# Patient Record
Sex: Female | Born: 1935 | Hispanic: No | Smoking: Never smoker
Health system: Southern US, Community
[De-identification: ages and names within clinical notes are randomized; demographics above are authoritative.]

## PROBLEM LIST (undated history)

## (undated) DIAGNOSIS — C801 Malignant (primary) neoplasm, unspecified: Secondary | ICD-10-CM

## (undated) DIAGNOSIS — E0590 Thyrotoxicosis, unspecified without thyrotoxic crisis or storm: Secondary | ICD-10-CM

## (undated) DIAGNOSIS — E049 Nontoxic goiter, unspecified: Secondary | ICD-10-CM

## (undated) DIAGNOSIS — E785 Hyperlipidemia, unspecified: Secondary | ICD-10-CM

## (undated) DIAGNOSIS — K5909 Other constipation: Secondary | ICD-10-CM

## (undated) DIAGNOSIS — E119 Type 2 diabetes mellitus without complications: Secondary | ICD-10-CM

## (undated) DIAGNOSIS — I10 Essential (primary) hypertension: Secondary | ICD-10-CM

## (undated) HISTORY — PX: MASTECTOMY: SHX3

---

## 1994-11-04 HISTORY — PX: BREAST LUMPECTOMY: SHX2

## 2005-11-04 HISTORY — PX: ABDOMINAL HYSTERECTOMY: SHX81

## 2012-12-24 ENCOUNTER — Encounter (INDEPENDENT_AMBULATORY_CARE_PROVIDER_SITE_OTHER): Payer: Self-pay | Admitting: General Surgery

## 2012-12-28 ENCOUNTER — Telehealth: Payer: Self-pay | Admitting: *Deleted

## 2012-12-28 NOTE — Telephone Encounter (Signed)
Left message for pt to return my call so I can schedule a Med Onc appt. 

## 2012-12-28 NOTE — Telephone Encounter (Signed)
Confirmed 01/01/13 appt w/ pt's daughter Kamela Blansett.  Angela Burke at referring to make her aware.  Unable to mail before letter & packet - gave verbal.  Emailed Christy at [facility] to make her aware.  Took paperwork to Med Rec for chart.

## 2012-12-29 ENCOUNTER — Telehealth: Payer: Self-pay | Admitting: *Deleted

## 2012-12-29 NOTE — Telephone Encounter (Signed)
Pt's daughter called stating that her mom is very overwhelmed right now and requested that we reschedule this appt.  Confirmed 01/18/13 appt w/ Dani.  Mailed before appt letter & packet to her.  Emailed Christy at [facility] to make her aware.  Took updated paperwork to med rec for chart.

## 2012-12-30 ENCOUNTER — Encounter (INDEPENDENT_AMBULATORY_CARE_PROVIDER_SITE_OTHER): Payer: Self-pay

## 2012-12-31 ENCOUNTER — Ambulatory Visit (INDEPENDENT_AMBULATORY_CARE_PROVIDER_SITE_OTHER): Payer: 59 | Admitting: General Surgery

## 2012-12-31 ENCOUNTER — Encounter (INDEPENDENT_AMBULATORY_CARE_PROVIDER_SITE_OTHER): Payer: Self-pay | Admitting: General Surgery

## 2012-12-31 VITALS — BP 136/72 | HR 86 | Temp 98.0°F | Resp 18 | Ht 60.0 in | Wt 152.0 lb

## 2012-12-31 DIAGNOSIS — Z853 Personal history of malignant neoplasm of breast: Secondary | ICD-10-CM

## 2012-12-31 DIAGNOSIS — R920 Mammographic microcalcification found on diagnostic imaging of breast: Secondary | ICD-10-CM

## 2012-12-31 NOTE — Patient Instructions (Addendum)
I will call you after your evaluation at the breast imaging center for a final decision on type of biopsy

## 2012-12-31 NOTE — Progress Notes (Signed)
Subjective:   Abnormal mammogram right breast  Patient ID: Gloria Wheeler, female   DOB: Oct 20, 1926, 79 y.o.   MRN: [id number]  HPI Patient is a pleasant 79 year old female recently moved to [facility] referred by the [facility] for an abnormal mammogram. The patient is a somewhat vague historian but is accompanied by her daughter who is a very good historian. He also have old records. The patient has a history of ductal carcinoma in situ of the left breast treated with lumpectomy and radiation about 1996. 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 She received 5 years of tamoxifen. The patient about 4 months ago was found to have enlargement of the previously noted area of microcalcifications in the upper-outer quadrant of the right breast. Initially the patient and family opted for 6 month followup. However they decided to proceed with biopsy and they did not want to have a stereotactic biopsy when the patient alert she would have to lie prone for about an hour as she did not feel she can tolerate this. There year to discuss possible surgical biopsy. She has not had any breast symptoms. Specifically no lumps or pain or discharge.  No past medical history on file. Past Surgical History  Procedure Laterality Date  . Abdominal hysterectomy  2007  . Breast surgery      left masty  . Breast lumpectomy  1996    left   Current Outpatient Prescriptions  Medication Sig Dispense Refill  . B-D INS SYRINGE 0.5CC/30GX1/2" 30G X 1/2" 0.5 ML MISC       . calcium citrate-vitamin D (CITRACAL+D) 315-200 MG-UNIT per tablet Take 1 tablet by mouth 2 (two) times daily.      Marland Kitchen FORA V30A BLOOD GLUCOSE TEST test strip       . HUMULIN N 100 UNIT/ML injection       . lovastatin (MEVACOR) 20 MG tablet Take 20 mg by mouth at bedtime.      . methimazole (TAPAZOLE) 5 MG tablet       . TOPROL XL 50 MG 24 hr tablet       . vitamin C  (ASCORBIC ACID) 500 MG tablet Take 500 mg by mouth daily.      Marland Kitchen liver oil-zinc oxide (DESITIN) 40 % ointment Apply topically as needed for dry skin.       No current facility-administered medications for this visit.   No Known Allergies History  Substance Use Topics  . Smoking status: Never Smoker   . Smokeless tobacco: Not on file  . Alcohol Use: Not on file     Review of Systems  Constitutional: Positive for fatigue.  Respiratory: Negative.   Cardiovascular: Positive for leg swelling. Negative for chest pain.  Gastrointestinal: Positive for constipation.  Musculoskeletal: Positive for arthralgias.       Objective:   Physical Exam BP 136/72  Pulse 86  Temp(Src) 98 F (36.7 C)  Resp 18  Ht 5' (1.524 m)  Wt 152 lb (68.947 kg)  BMI 29.69 kg/m2 General: Elderly pleasant Afro-American female in no distress Skin: No rash infection Lymph nodes: No palpable cervical, supraclavicular, or axillary nodes HEENT: No Pepple mass or thyromegaly. Sclera nonicteric Lungs: Clear equal breath sounds bilaterally Breasts: Well-healed left mastectomy without skin changes or mass. Axilla is negative. Right breast is negative to exam without mass or skin change or nipple discharge. Cardiovascular: 1+ lower extremity edema. Regular rate and rhythm  Abdomen: Soft and nontender. No masses or organomegaly Neurologic: She is alert and fully oriented. No gross motor deficits.  Imaging: I have her mammograms on disc but on the images I am able to open him unable to see the calcifications.    Assessment:     79 year old female with history of ductal carcinoma in situ of the left breast status post mastectomy. Mammogram report indicates an increasing area of microcalcifications in the upper-outer quadrant of the right breast. I discussed this with the patient and her daughter in detail. I would like her to go to the breast center here so that they can review the previous images and discussed with the  patient and her daughter details of stereotactic biopsy. If at that time they still prefer an open surgical biopsy we would proceed with needle localized right breast lumpectomy. We discussed the nature of the surgery including the use of general anesthesia and risks involved in terms of cardiovascular complications as well as bleeding and infection and possible need for further surgery based on final diagnoses. They understand the localization would be necessary and as she's going to the breast center first they would be on board to do this procedure. I will await their decision following evaluation at the breast center.    Plan:     As above

## 2013-01-01 ENCOUNTER — Other Ambulatory Visit: Payer: Self-pay | Admitting: Lab

## 2013-01-01 ENCOUNTER — Ambulatory Visit: Payer: Self-pay | Admitting: Oncology

## 2013-01-01 ENCOUNTER — Ambulatory Visit: Payer: Self-pay

## 2013-01-07 ENCOUNTER — Other Ambulatory Visit: Payer: Self-pay | Admitting: *Deleted

## 2013-01-11 ENCOUNTER — Other Ambulatory Visit (INDEPENDENT_AMBULATORY_CARE_PROVIDER_SITE_OTHER): Payer: Self-pay | Admitting: General Surgery

## 2013-01-11 ENCOUNTER — Telehealth (INDEPENDENT_AMBULATORY_CARE_PROVIDER_SITE_OTHER): Payer: Self-pay

## 2013-01-11 NOTE — Telephone Encounter (Signed)
Called and left message for patient regarding appointment at [facility] for further Evaluation.  Appointment scheduled for Tuesday 01/12/13 @ 10:00 am.

## 2013-01-12 ENCOUNTER — Encounter (INDEPENDENT_AMBULATORY_CARE_PROVIDER_SITE_OTHER): Payer: Self-pay

## 2013-01-18 ENCOUNTER — Other Ambulatory Visit (HOSPITAL_BASED_OUTPATIENT_CLINIC_OR_DEPARTMENT_OTHER): Payer: Medicare PPO | Admitting: Lab

## 2013-01-18 ENCOUNTER — Encounter: Payer: Self-pay | Admitting: Oncology

## 2013-01-18 ENCOUNTER — Ambulatory Visit: Payer: Medicare Other

## 2013-01-18 ENCOUNTER — Telehealth: Payer: Self-pay | Admitting: *Deleted

## 2013-01-18 ENCOUNTER — Ambulatory Visit (HOSPITAL_BASED_OUTPATIENT_CLINIC_OR_DEPARTMENT_OTHER): Payer: Medicare PPO | Admitting: Oncology

## 2013-01-18 VITALS — BP 175/86 | HR 98 | Temp 98.1°F | Resp 20 | Ht 60.0 in | Wt 151.5 lb

## 2013-01-18 DIAGNOSIS — Z853 Personal history of malignant neoplasm of breast: Secondary | ICD-10-CM

## 2013-01-18 LAB — COMPREHENSIVE METABOLIC PANEL (CC13)
ALT: 13 U/L (ref 0–55)
AST: 21 U/L (ref 5–34)
CO2: 26 mEq/L (ref 22–29)
Calcium: 9.7 mg/dL (ref 8.4–10.4)
Chloride: 105 mEq/L (ref 98–107)
Sodium: 139 mEq/L (ref 136–145)
Total Bilirubin: 0.33 mg/dL (ref 0.20–1.20)
Total Protein: 6.9 g/dL (ref 6.4–8.3)

## 2013-01-18 LAB — CBC WITH DIFFERENTIAL/PLATELET
BASO%: 1.1 % (ref 0.0–2.0)
MCHC: 33.9 g/dL (ref 31.5–36.0)
MONO#: 0.3 10*3/uL (ref 0.1–0.9)
RBC: 3.87 10*6/uL (ref 3.70–5.45)
RDW: 14.5 % (ref 11.2–14.5)
WBC: 5.5 10*3/uL (ref 3.9–10.3)
lymph#: 1.1 10*3/uL (ref 0.9–3.3)

## 2013-01-18 NOTE — Progress Notes (Signed)
Gloria Wheeler [id number] 1926-02-11 79 y.o. 01/18/2013 2:26 PM  CC  Geraldo Pitter, MD [address] [place] [address] Dr. Johna Sheriff  REASON FOR CONSULTATION:  79 year old female with prior history of DCIS of the left breast status post lumpectomy now with a right-sided breast abnormality.  STAGE:   Unknown  REFERRING PHYSICIAN: Dr. Sharlet Salina Hoxworth  HISTORY OF PRESENT ILLNESS:  Gloria Wheeler is a 79 y.o. female.  With prior oncologic history significant for left breast cancer originally diagnosed in the 1990s. At that time she underwent a lumpectomy followed by radiation therapy. She was then followed and developed a recurrence of DCIS about 5 years ago. She had a left mastectomy followed by 5 years of tamoxifen. She recently relocated to [facility] from [place]. She had mammograms performed of the right breast that showed an abnormality. She was seen by Dr. Glenna Fellows who recommended a biopsy. This has not been performed yet but she is scheduled to have this done on Wednesday of this week. Clinically she seems to be doing well and is without any complaints.   Past Medical History: History reviewed. No pertinent past medical history.  Past Surgical History: Past Surgical History  Procedure Laterality Date  . Abdominal hysterectomy  2007  . Breast surgery      left masty  . Breast lumpectomy  1996    left    Family History: Family History  Problem Relation Age of Onset  . Diabetes Mother   . Diabetes Daughter   . Multiple sclerosis Son     Social History History  Substance Use Topics  . Smoking status: Never Smoker   . Smokeless tobacco: Never Used  . Alcohol Use: 0.6 oz/week    1 Cans of beer per week    Allergies: No Known Allergies  Current Medications: Current Outpatient Prescriptions  Medication Sig Dispense Refill  . B-D INS SYRINGE 0.5CC/30GX1/2" 30G X 1/2" 0.5 ML MISC       . calcium citrate-vitamin D (CITRACAL+D)  315-200 MG-UNIT per tablet Take 1 tablet by mouth 2 (two) times daily.      . Cholecalciferol (VITAMIN D-3 PO) Take 2,000 Int'l Units by mouth 2 (two) times daily.      . COD LIVER OIL PO Take by mouth daily.      Marland Kitchen FORA V30A BLOOD GLUCOSE TEST test strip       . HUMULIN N 100 UNIT/ML injection       . lovastatin (MEVACOR) 20 MG tablet Take 20 mg by mouth at bedtime.      . methimazole (TAPAZOLE) 5 MG tablet Take 5 mg by mouth daily.       . metoprolol succinate (TOPROL-XL) 100 MG 24 hr tablet Take 100 mg by mouth daily. Take with or immediately following a meal.      . vitamin C (ASCORBIC ACID) 500 MG tablet Take 500 mg by mouth daily.       No current facility-administered medications for this visit.    OB/GYN History:menarche12, menopause at age 57's, G71P2, first live birth at 75  Fertility Discussion: not applicable Prior History of Cancer: DCIS of left in [phone] s/p lumpectomy s/p radiation for 13 weeks, then 4 years ago had recurrence in 2008, tamoxifen x 5 years completed 2013  Health Maintenance: Colonoscopy yes Bone Density yes (pt with osteoporosis) Last PAP smear yes  ECOG PERFORMANCE STATUS: 1 - Symptomatic but completely ambulatory  Genetic Counseling/testing:none  REVIEW OF SYSTEMS:  A  comprehensive review of systems was negative.  PHYSICAL EXAMINATION: Blood pressure 175/86, pulse 98, temperature 98.1 F (36.7 C), temperature source Oral, resp. rate 20, height 5' (1.524 m), weight 151 lb 8 oz (68.72 kg).  ZOX:WRUEA, healthy and no distress SKIN: skin color, texture, turgor are normal HEAD: Normocephalic EYES: PERRLA, EOMI EARS: External ears normal OROPHARYNX:no exudate  NECK: no adenopathy LYMPH:  no palpable lymphadenopathy BREAST:right breast normal without mass, skin or nipple changes or axillary nodes, surgical scars noted from left mastectomy no evidence of local recurrence. LUNGS: clear to auscultation  HEART: regular rate & rhythm ABDOMEN:abdomen  soft, normal bowel sounds and no masses or organomegaly BACK: No CVA tenderness EXTREMITIES:no clubbing, no cyanosis, right lower extremity +1 edema  NEURO: alert & oriented x 3 with fluent speech, no focal motor/sensory deficits     STUDIES/RESULTS: No results found.   LABS:    Chemistry   No results found for this basename: NA, K, CL, CO2, BUN, CREATININE, GLU   No results found for this basename: CALCIUM, ALKPHOS, AST, ALT, BILITOT      Lab Results  Component Value Date   WBC 5.5 01/18/2013   HGB 11.4* 01/18/2013   HCT 33.7* 01/18/2013   MCV 87.1 01/18/2013   PLT 188 01/18/2013    ASSESSMENT    79 year old female with  #1 prior history of left breast cancer status post mastectomy followed by tamoxifen completed last year in 2013. Patient now with an abnormal right mammogram. She was found to have calcifications. She is scheduled to have a biopsy at the breast Center on Wednesday of this week. I discussed the possibilities with the patient and her daughter. Certainly prior to making any recommendations I would like to know what her final biopsy results are and what the surgery pathology is. Patient and daughter understand this.     PLAN:    #1 patient will proceed with her scheduled surgery.  #2 I will plan on seeing her back in about one to 2 months time for followup.     Thank you so much for allowing me to participate in the care of [facility]. I will continue to follow up the patient with you and assist in her care.  All questions were answered. The patient knows to call the clinic with any problems, questions or concerns. We can certainly see the patient much sooner if necessary.  I spent 40 minutes counseling the patient face to face. The total time spent in the appointment was 55 minutes.  Drue Second, MD Medical/Oncology [facility] [phone] (beeper) [phone] (Office)  01/18/2013, 3:22 PM

## 2013-01-18 NOTE — Progress Notes (Signed)
Checked in new patient with no financial issues. She did have her [facility] forms.

## 2013-01-18 NOTE — Patient Instructions (Addendum)
Proceed with surgery First   I will see you back in 2 months

## 2013-01-18 NOTE — Telephone Encounter (Signed)
appts made and printed 

## 2013-01-20 ENCOUNTER — Other Ambulatory Visit (INDEPENDENT_AMBULATORY_CARE_PROVIDER_SITE_OTHER): Payer: Self-pay | Admitting: General Surgery

## 2013-01-20 ENCOUNTER — Ambulatory Visit
Admission: RE | Admit: 2013-01-20 | Discharge: 2013-01-20 | Disposition: A | Discharge status: Home / Self Care | Payer: Medicare PPO | Source: Ambulatory Visit | Attending: General Surgery | Admitting: General Surgery

## 2013-01-28 ENCOUNTER — Encounter (INDEPENDENT_AMBULATORY_CARE_PROVIDER_SITE_OTHER): Payer: Self-pay

## 2013-02-09 ENCOUNTER — Ambulatory Visit
Admission: RE | Admit: 2013-02-09 | Discharge: 2013-02-09 | Disposition: A | Discharge status: Home / Self Care | Payer: Medicare PPO | Source: Ambulatory Visit | Attending: General Surgery | Admitting: General Surgery

## 2013-02-09 DIAGNOSIS — Z853 Personal history of malignant neoplasm of breast: Secondary | ICD-10-CM

## 2013-02-09 DIAGNOSIS — R921 Mammographic calcification found on diagnostic imaging of breast: Secondary | ICD-10-CM

## 2013-02-26 ENCOUNTER — Encounter: Payer: Self-pay | Admitting: *Deleted

## 2013-02-26 NOTE — Progress Notes (Signed)
Mailed after appt letter to pt. 

## 2013-03-15 ENCOUNTER — Ambulatory Visit: Payer: Medicare Other | Admitting: Oncology

## 2013-06-14 ENCOUNTER — Other Ambulatory Visit (INDEPENDENT_AMBULATORY_CARE_PROVIDER_SITE_OTHER): Payer: Self-pay | Admitting: General Surgery

## 2013-06-14 DIAGNOSIS — R921 Mammographic calcification found on diagnostic imaging of breast: Secondary | ICD-10-CM

## 2013-07-22 ENCOUNTER — Ambulatory Visit
Admission: RE | Admit: 2013-07-22 | Discharge: 2013-07-22 | Disposition: A | Discharge status: Home / Self Care | Payer: Medicare PPO | Source: Ambulatory Visit | Attending: General Surgery | Admitting: General Surgery

## 2013-07-22 DIAGNOSIS — R921 Mammographic calcification found on diagnostic imaging of breast: Secondary | ICD-10-CM

## 2013-11-08 ENCOUNTER — Ambulatory Visit: Payer: Self-pay | Admitting: Podiatry

## 2013-11-09 ENCOUNTER — Encounter (HOSPITAL_COMMUNITY): Payer: Self-pay | Admitting: Emergency Medicine

## 2013-11-09 ENCOUNTER — Emergency Department (HOSPITAL_COMMUNITY): Payer: Medicare PPO

## 2013-11-09 ENCOUNTER — Observation Stay (HOSPITAL_COMMUNITY)
Admission: EM | Admit: 2013-11-09 | Discharge: 2013-11-10 | Disposition: A | Discharge status: Home / Self Care | Payer: Medicare PPO | Attending: Internal Medicine | Admitting: Internal Medicine

## 2013-11-09 DIAGNOSIS — E0590 Thyrotoxicosis, unspecified without thyrotoxic crisis or storm: Secondary | ICD-10-CM | POA: Diagnosis present

## 2013-11-09 DIAGNOSIS — E049 Nontoxic goiter, unspecified: Secondary | ICD-10-CM

## 2013-11-09 DIAGNOSIS — R079 Chest pain, unspecified: Secondary | ICD-10-CM | POA: Diagnosis present

## 2013-11-09 DIAGNOSIS — R0609 Other forms of dyspnea: Secondary | ICD-10-CM | POA: Insufficient documentation

## 2013-11-09 DIAGNOSIS — E119 Type 2 diabetes mellitus without complications: Secondary | ICD-10-CM | POA: Insufficient documentation

## 2013-11-09 DIAGNOSIS — Z79899 Other long term (current) drug therapy: Secondary | ICD-10-CM | POA: Insufficient documentation

## 2013-11-09 DIAGNOSIS — C50919 Malignant neoplasm of unspecified site of unspecified female breast: Secondary | ICD-10-CM | POA: Insufficient documentation

## 2013-11-09 DIAGNOSIS — Z794 Long term (current) use of insulin: Secondary | ICD-10-CM | POA: Insufficient documentation

## 2013-11-09 DIAGNOSIS — R0789 Other chest pain: Principal | ICD-10-CM | POA: Insufficient documentation

## 2013-11-09 DIAGNOSIS — Z853 Personal history of malignant neoplasm of breast: Secondary | ICD-10-CM

## 2013-11-09 DIAGNOSIS — I517 Cardiomegaly: Secondary | ICD-10-CM | POA: Insufficient documentation

## 2013-11-09 DIAGNOSIS — I10 Essential (primary) hypertension: Secondary | ICD-10-CM | POA: Diagnosis present

## 2013-11-09 DIAGNOSIS — J988 Other specified respiratory disorders: Secondary | ICD-10-CM

## 2013-11-09 DIAGNOSIS — R0989 Other specified symptoms and signs involving the circulatory and respiratory systems: Secondary | ICD-10-CM | POA: Insufficient documentation

## 2013-11-09 DIAGNOSIS — M899 Disorder of bone, unspecified: Secondary | ICD-10-CM | POA: Insufficient documentation

## 2013-11-09 DIAGNOSIS — R0600 Dyspnea, unspecified: Secondary | ICD-10-CM | POA: Diagnosis present

## 2013-11-09 DIAGNOSIS — I700 Atherosclerosis of aorta: Secondary | ICD-10-CM | POA: Insufficient documentation

## 2013-11-09 DIAGNOSIS — I079 Rheumatic tricuspid valve disease, unspecified: Secondary | ICD-10-CM | POA: Insufficient documentation

## 2013-11-09 DIAGNOSIS — E785 Hyperlipidemia, unspecified: Secondary | ICD-10-CM | POA: Insufficient documentation

## 2013-11-09 DIAGNOSIS — I080 Rheumatic disorders of both mitral and aortic valves: Secondary | ICD-10-CM | POA: Insufficient documentation

## 2013-11-09 DIAGNOSIS — R071 Chest pain on breathing: Secondary | ICD-10-CM | POA: Insufficient documentation

## 2013-11-09 DIAGNOSIS — E0500 Thyrotoxicosis with diffuse goiter without thyrotoxic crisis or storm: Secondary | ICD-10-CM | POA: Insufficient documentation

## 2013-11-09 DIAGNOSIS — M949 Disorder of cartilage, unspecified: Secondary | ICD-10-CM

## 2013-11-09 DIAGNOSIS — J398 Other specified diseases of upper respiratory tract: Secondary | ICD-10-CM | POA: Insufficient documentation

## 2013-11-09 HISTORY — DX: Nontoxic goiter, unspecified: E04.9

## 2013-11-09 HISTORY — DX: Type 2 diabetes mellitus without complications: E11.9

## 2013-11-09 HISTORY — DX: Essential (primary) hypertension: I10

## 2013-11-09 HISTORY — DX: Hyperlipidemia, unspecified: E78.5

## 2013-11-09 HISTORY — DX: Thyrotoxicosis, unspecified without thyrotoxic crisis or storm: E05.90

## 2013-11-09 HISTORY — DX: Malignant (primary) neoplasm, unspecified: C80.1

## 2013-11-09 LAB — POCT I-STAT TROPONIN I: Troponin i, poc: 0.01 ng/mL (ref 0.00–0.08)

## 2013-11-09 LAB — CBC WITH DIFFERENTIAL/PLATELET
Basophils Absolute: 0 10*3/uL (ref 0.0–0.1)
Basophils Relative: 0 % (ref 0–1)
Eosinophils Absolute: 0.2 10*3/uL (ref 0.0–0.7)
Eosinophils Relative: 2 % (ref 0–5)
HCT: 38.9 % (ref 36.0–46.0)
Hemoglobin: 13.1 g/dL (ref 12.0–15.0)
Lymphocytes Relative: 15 % (ref 12–46)
Lymphs Abs: 1.2 10*3/uL (ref 0.7–4.0)
MCH: 29.1 pg (ref 26.0–34.0)
MCHC: 33.7 g/dL (ref 30.0–36.0)
MCV: 86.4 fL (ref 78.0–100.0)
Monocytes Absolute: 0.5 10*3/uL (ref 0.1–1.0)
Monocytes Relative: 6 % (ref 3–12)
Neutro Abs: 6.4 10*3/uL (ref 1.7–7.7)
Neutrophils Relative %: 76 % (ref 43–77)
Platelets: 173 10*3/uL (ref 150–400)
RBC: 4.5 MIL/uL (ref 3.87–5.11)
RDW: 14.6 % (ref 11.5–15.5)
WBC: 8.3 10*3/uL (ref 4.0–10.5)

## 2013-11-09 LAB — HEPATIC FUNCTION PANEL
ALT: 17 U/L (ref 0–35)
AST: 36 U/L (ref 0–37)
Albumin: 3.7 g/dL (ref 3.5–5.2)
Alkaline Phosphatase: 70 U/L (ref 39–117)
Bilirubin, Direct: <0.2 mg/dL (ref 0.0–0.3)
Total Bilirubin: 0.4 mg/dL (ref 0.3–1.2)
Total Protein: 7.7 g/dL (ref 6.0–8.3)

## 2013-11-09 LAB — D-DIMER, QUANTITATIVE: D-Dimer, Quant: 2.34 ug/mL-FEU — ABNORMAL HIGH (ref 0.00–0.48)

## 2013-11-09 LAB — PRO B NATRIURETIC PEPTIDE: Pro B Natriuretic peptide (BNP): 1084 pg/mL — ABNORMAL HIGH (ref 0–450)

## 2013-11-09 LAB — BASIC METABOLIC PANEL
BUN: 14 mg/dL (ref 6–23)
CO2: 24 mEq/L (ref 19–32)
Calcium: 9.6 mg/dL (ref 8.4–10.5)
Chloride: 101 mEq/L (ref 96–112)
Creatinine, Ser: 0.68 mg/dL (ref 0.50–1.10)
GFR calc Af Amer: 90 mL/min — ABNORMAL LOW (ref >90)
GFR calc non Af Amer: 78 mL/min — ABNORMAL LOW (ref >90)
Glucose, Bld: 125 mg/dL — ABNORMAL HIGH (ref 70–99)
Potassium: 4.7 mEq/L (ref 3.7–5.3)
Sodium: 140 mEq/L (ref 137–147)

## 2013-11-09 LAB — GLUCOSE, CAPILLARY
Glucose-Capillary: 92 mg/dL (ref 70–99)
Glucose-Capillary: 230 mg/dL — ABNORMAL HIGH (ref 70–99)

## 2013-11-09 LAB — TROPONIN I: Troponin I: <0.3 ng/mL (ref <0.30)

## 2013-11-09 MED ORDER — ENOXAPARIN SODIUM 40 MG/0.4ML ~~LOC~~ SOLN
40.0000 mg | SUBCUTANEOUS | Status: DC
  Administered 2013-11-09: 40 mg via SUBCUTANEOUS
  Filled 2013-11-09 (×2): qty 0.4

## 2013-11-09 MED ORDER — ISOSORB DINITRATE-HYDRALAZINE 20-37.5 MG PO TABS
1.0000 | ORAL_TABLET | Freq: Two times a day (BID) | ORAL | Status: DC
  Administered 2013-11-09 – 2013-11-10 (×2): 1 via ORAL
  Filled 2013-11-09 (×3): qty 1

## 2013-11-09 MED ORDER — NITROGLYCERIN 0.4 MG SL SUBL: 0.4000 mg | SUBLINGUAL_TABLET | SUBLINGUAL | Status: DC | PRN

## 2013-11-09 MED ORDER — VITAMIN C 500 MG PO TABS
500.0000 mg | ORAL_TABLET | Freq: Every morning | ORAL | Status: DC
Start: 2013-11-09 — End: 2013-11-10
  Administered 2013-11-09 – 2013-11-10 (×2): 500 mg via ORAL
  Filled 2013-11-09 (×2): qty 1

## 2013-11-09 MED ORDER — SIMVASTATIN 10 MG PO TABS
10.0000 mg | ORAL_TABLET | Freq: Every day | ORAL | Status: DC
  Administered 2013-11-09: 20:00:00 10 mg via ORAL
  Filled 2013-11-09 (×2): qty 1

## 2013-11-09 MED ORDER — MORPHINE SULFATE 2 MG/ML IJ SOLN: 2.0000 mg | INTRAMUSCULAR | Status: DC | PRN

## 2013-11-09 MED ORDER — METHIMAZOLE 5 MG PO TABS
5.0000 mg | ORAL_TABLET | Freq: Every morning | ORAL | Status: DC
  Administered 2013-11-10: 10:00:00 5 mg via ORAL
  Filled 2013-11-09: qty 1

## 2013-11-09 MED ORDER — LINACLOTIDE 145 MCG PO CAPS
145.0000 ug | ORAL_CAPSULE | Freq: Every morning | ORAL | Status: DC
  Administered 2013-11-09 – 2013-11-10 (×2): 145 ug via ORAL
  Filled 2013-11-09 (×2): qty 1

## 2013-11-09 MED ORDER — VITAMIN D3 25 MCG (1000 UNIT) PO TABS
2000.0000 [IU] | ORAL_TABLET | Freq: Every morning | ORAL | Status: DC
  Administered 2013-11-09 – 2013-11-10 (×2): 2000 [IU] via ORAL
  Filled 2013-11-09 (×2): qty 2

## 2013-11-09 MED ORDER — INSULIN ASPART 100 UNIT/ML ~~LOC~~ SOLN
0.0000 [IU] | Freq: Every day | SUBCUTANEOUS | Status: DC
  Administered 2013-11-09: 22:00:00 2 [IU] via SUBCUTANEOUS

## 2013-11-09 MED ORDER — IOHEXOL 350 MG/ML SOLN
100.0000 mL | Freq: Once | INTRAVENOUS | Status: AC | PRN
  Administered 2013-11-09: 100 mL via INTRAVENOUS

## 2013-11-09 MED ORDER — ONDANSETRON HCL 4 MG/2ML IJ SOLN: 4.0000 mg | Freq: Four times a day (QID) | INTRAMUSCULAR | Status: DC | PRN

## 2013-11-09 MED ORDER — ACETAMINOPHEN 325 MG PO TABS: 650.0000 mg | ORAL_TABLET | ORAL | Status: DC | PRN

## 2013-11-09 MED ORDER — ASPIRIN EC 325 MG PO TBEC
325.0000 mg | DELAYED_RELEASE_TABLET | Freq: Every day | ORAL | Status: DC
  Administered 2013-11-10: 325 mg via ORAL
  Filled 2013-11-09: qty 1

## 2013-11-09 MED ORDER — METOPROLOL SUCCINATE ER 100 MG PO TB24
100.0000 mg | ORAL_TABLET | Freq: Every morning | ORAL | Status: DC
  Administered 2013-11-09 – 2013-11-10 (×2): 100 mg via ORAL
  Filled 2013-11-09 (×2): qty 1

## 2013-11-09 MED ORDER — ASPIRIN 81 MG PO CHEW
324.0000 mg | CHEWABLE_TABLET | Freq: Once | ORAL | Status: AC
Start: 2013-11-09 — End: 2013-11-09
  Administered 2013-11-09: 324 mg via ORAL
  Filled 2013-11-09: qty 4

## 2013-11-09 MED ORDER — INSULIN ASPART 100 UNIT/ML ~~LOC~~ SOLN
0.0000 [IU] | Freq: Three times a day (TID) | SUBCUTANEOUS | Status: DC
  Administered 2013-11-10: 12:00:00 2 [IU] via SUBCUTANEOUS

## 2013-11-09 NOTE — ED Notes (Signed)
Admitting MD at bedside.

## 2013-11-09 NOTE — ED Notes (Signed)
PT c/o chest tightness x 1 day, states when she takes a deep breathe she feels a pressure on the left side of chest. Pt denies any other symptoms, NAD. Pt has SOB x 1 month has been to PCP was referred to cardiologist and has an appointment at the end of the month.

## 2013-11-09 NOTE — ED Notes (Signed)
Attempted to start PIV and lab draw x2, no success, second RN at bedside to assess.

## 2013-11-09 NOTE — H&P (Signed)
[facility] History and Physical  Gloria Wheeler OVF:[id number] DOB: 08/20/1926 DOA: 11/09/2013  Referring physician: Dr. Ilona Sorrel PCP: Elyn Peers, MD   Chief Complaint: Chest tightness.   History of Present Illness: Gloria Wheeler is an 80 y.o. female with a PMH of HTN, DM, remote mastectomy for breast cancer, who presents to the ER with a chief complaint of the gradual onset of left chest heaviness/tightness associated with some labored breathing when she was getting ready to go on a shopping trip with her daughter.  No current chest pain or tightness.  Symptoms were aggravated by moving her left arm.  Acknowledges that she gets anxious and this affects her breathing since her husband died 1 week ago.  She is scheduled to see a cardiologist in 1 month to evaluate exertional dyspnea.  The patient is a poor historian, and her daughter has stepped out of the room and is not currently available for me to interview.  Review of Systems: Constitutional: No fever, no chills;  Appetite normal; No weight loss, + weight gain, + occasional fatigue.  HEENT: No blurry vision, no diplopia, no pharyngitis, no dysphagia CV: No chest pain, no palpitations, no PND, +ankle swelling.  Resp: No current SOB, no cough, no pleuritic pain. GI: No nausea, no vomiting, no diarrhea, no melena, no hematochezia, + constipation.  GU: No dysuria, no hematuria, no frequency, no urgency. MSK: no myalgias, no arthralgias.  Neuro:  No headache, no focal neurological deficits, no history of seizures.  Psych: + depression (recently widowed), no anxiety.  Endo: No heat intolerance, no cold intolerance, no polyuria, no polydipsia  Skin: No rashes, no skin lesions.  Heme: No easy bruising.  Travel history: None recent.  Past Medical History Past Medical History  Diagnosis Date  . lt breast ca w/ recurrence dx'd 1993 and 2006    lumpectomy and then masectomy  . Diabetes mellitus without complication   . Hypertension   .  Hyperlipidemia   . Hyperthyroidism      Past Surgical History Past Surgical History  Procedure Laterality Date  . Abdominal hysterectomy  2007  . Mastectomy Left   . Breast lumpectomy  1996    left     Social History: History   Social History  . Marital Status: Married    Spouse Name: N/A    Number of Children: 2  . Years of Education: N/A   Occupational History  . Not on file.   Social History Main Topics  . Smoking status: Never Smoker   . Smokeless tobacco: Never Used  . Alcohol Use: 0.6 oz/week    1 Cans of beer per week  . Drug Use: No  . Sexual Activity: Not Currently   Other Topics Concern  . Not on file   Social History Narrative   Recently widowed (1/15).  Previously married for 14 years.  Independent of ADLs.  Ambulates independently.    Family History:  Family History  Problem Relation Age of Onset  . Diabetes Mother   . Diabetes Daughter   . Multiple sclerosis Son     Allergies: Review of patient's allergies indicates no known allergies.  Meds: Prior to Admission medications   Medication Sig Start Date End Date Taking? Authorizing Provider  cholecalciferol (VITAMIN D) 1000 UNITS tablet Take 2,000 Units by mouth every morning.   Yes Historical Provider, MD  HUMULIN N 100 UNIT/ML injection Inject 10 Units into the skin every evening.  12/13/12  Yes Historical Provider, MD  isosorbide-hydrALAZINE (  BIDIL) 20-37.5 MG per tablet Take 1 tablet by mouth 2 (two) times daily.   Yes Historical Provider, MD  Linaclotide (LINZESS) 145 MCG CAPS capsule Take 145 mcg by mouth every morning.   Yes Historical Provider, MD  lovastatin (MEVACOR) 20 MG tablet Take 20 mg by mouth at bedtime.   Yes Historical Provider, MD  methimazole (TAPAZOLE) 5 MG tablet Take 5 mg by mouth every morning.  11/11/12  Yes Historical Provider, MD  metoprolol succinate (TOPROL-XL) 100 MG 24 hr tablet Take 100 mg by mouth every morning. Take with or immediately following a meal.   Yes  Historical Provider, MD  vitamin C (ASCORBIC ACID) 500 MG tablet Take 500 mg by mouth every morning.    Yes Historical Provider, MD    Physical Exam: Filed Vitals:   11/09/13 1017 11/09/13 1544  BP: 188/94 150/76  Pulse:  87  Temp:  98.3 F (36.8 C)  TempSrc:  Oral  Resp: 24 20  SpO2: 97% 95%     Physical Exam: Blood pressure 150/76, pulse 87, temperature 98.3 F (36.8 C), temperature source Oral, resp. rate 20, SpO2 95.00%. Gen: No acute distress. Head: Normocephalic, atraumatic. Eyes: PERRL, EOMI, sclerae nonicteric. Mouth: Oropharynx clear with poor dentition, missing teeth, caries.  Mucous membranes moist. Neck: Supple, no thyromegaly, no lymphadenopathy, no jugular venous distention. Chest: Lungs CTAB with good air movement. CV: Heart sounds are regular with a III/VI SEM. Abdomen: Soft, nontender, nondistended with normal active bowel sounds. Extremities: Extremities without C/E/C. Skin: Warm and dry. Neuro: Alert and oriented times 3; cranial nerves II through XII grossly intact. Psych: Mood and affect normal.  Labs on Admission:  Basic Metabolic Panel:  Recent Labs Lab 11/09/13 1215  NA 140  K 4.7  CL 101  CO2 24  GLUCOSE 125*  BUN 14  CREATININE 0.68  CALCIUM 9.6   Liver Function Tests: No results found for this basename: AST, ALT, ALKPHOS, BILITOT, PROT, ALBUMIN,  in the last 168 hours No results found for this basename: LIPASE, AMYLASE,  in the last 168 hours No results found for this basename: AMMONIA,  in the last 168 hours CBC:  Recent Labs Lab 11/09/13 1215  WBC 8.3  NEUTROABS 6.4  HGB 13.1  HCT 38.9  MCV 86.4  PLT 173   CBG:  Recent Labs Lab 11/09/13 1346  GLUCAP 92    Radiological Exams on Admission: Dg Chest 2 View  11/09/2013   CLINICAL DATA:  Chest pain on inspiration.  Nonsmoker.  EXAM: CHEST  2 VIEW  COMPARISON:  None.  FINDINGS: There are low lung volumes and mild patient rotation to the right. The patient's mandible  partially overlies the lung apices. The heart is enlarged. Right hilar fullness is likely vascular. There is central airway thickening and mild vascular congestion. No confluent airspace opacity or significant pleural effusion is identified. A small nodular density projects over the anterior aspect of the right 3rd rib on the frontal examination, not clearly seen on the lateral view.  Surgical clips are present within the left axilla status post apparent left mastectomy. There are glenohumeral degenerative changes bilaterally.  IMPRESSION: 1. Cardiomegaly with vascular congestion. No overt edema or confluent airspace opacity. 2. Small perihilar nodular density on the right is potentially due to an overlap of vascular and osseous structures. A small pulmonary nodule cannot be excluded. Given the low lung volumes and rotation on the current frontal examination, a follow-up PA view with better inspiration and positioning is recommended  to see if this is persistent.   Electronically Signed   By: Camie Patience M.D.   On: 11/09/2013 11:15   Ct Angio Chest Pe W/cm &/or Wo Cm  11/09/2013   CLINICAL DATA:  80 year old female with chest pain, chest tightness with deep inspiration. Abnormal D-dimer. Initial encounter.  EXAM: CT ANGIOGRAPHY CHEST WITH CONTRAST  TECHNIQUE: Multidetector CT imaging of the chest was performed using the standard protocol during bolus administration of intravenous contrast. Multiplanar CT image reconstructions including MIPs were obtained to evaluate the vascular anatomy.  CONTRAST:  11mL OMNIPAQUE IOHEXOL 350 MG/ML SOLN  COMPARISON:  Chest radiographs from the same day reported separately.  FINDINGS: Good contrast bolus timing in the pulmonary arterial tree. Enlarged central pulmonary arteries, 33 diameter main pulmonary artery versus 30 mm diameter adjacent ascending aorta. Mild respiratory motion artifact.  No focal filling defect identified in the pulmonary arterial tree to suggest the  presence of acute pulmonary embolism.  Large thyroid goiter, heterogeneous confluent left lobe thyroid nodule or mass with retrosternal extension measures 60 mm diameter. Rightward deviation of the trachea at the thoracic inlet. No significant airway narrowing visualized. Major airways are patent. Dependent pulmonary atelectasis.  Cardiomegaly. No pericardial or pleural effusion. Calcified atherosclerosis of the aorta and coronary arteries. Negative visualized upper abdominal viscera. Postoperative changes to the left chest wall and axilla.  Osteopenia. Degenerative changes in the thoracic spine. No acute or suspicious osseous lesion identified.  Review of the MIP images confirms the above findings.  IMPRESSION: 1.  No evidence of acute pulmonary embolus. 2. 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 Rightward deviation of the trachea but no significant airway narrowing. 3. Pulmonary atelectasis. 4. Cardiomegaly and atherosclerosis.   Electronically Signed   By: Lars Pinks M.D.   On: 11/09/2013 14:22    EKG: Independently reviewed. NSR with PACs at 92 bpm.  Non-specific T wave abnormalities.  No old EKG for comparison.  Assessment/Plan Principal Problem:   Chest tightness Admit the patient to telemetry and cycle cardiac markers.  Given heart murmur, check a BNP and 2 D Echo.  Continue Imdur, start ASA.  Check FLP to ensure her lipids are well controlled.  No current chest pain, so doubt this is unstable angina.  Hold off on heparin. Active Problems:   HX: breast cancer The patient has a history of ductal carcinoma in situ of the left breast treated with lumpectomy and radiation in 1996. 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 She received 5 years of tamoxifen. She had a biopsy to evaluate microcalcifications 01/2013, but results are not available and she has not had any F/U appointments with Dr.  Humphrey Rolls.   Goiter / Hyperthyroidism Check TSH, continue Tapazole.   Diabetes mellitus without complication Hold NPH.  Start SSI, insulin sensitive scale Q AC/HS.  Check hemoglobin A1c.   Hypertension Continue Bidil and Toprol.   Hyperlipidemia Continue Mevacor.  Check FLP.   Dyspnea May be from compression of the trache from goiter.  Given cardiomegaly and vascular congestion on CXR, rule out CHF.  Check BNP and obtain a 2 D Echo. CT angiogram negative for PE.   Code Status: Full. Family Communication: Daughter Rudolph Dobler [phone]) is primary contact (not currently present). Disposition Plan: Home when stable.  Time spent: 70 minutes.  Dailey Buccheri [facility] Pager [phone]  If 7PM-7AM, please contact night-coverage [email or web address] Password [email or web address] 11/09/2013, 4:14 PM

## 2013-11-09 NOTE — ED Provider Notes (Signed)
CSN: [id number]     Arrival date & time 11/09/13  1005 History   First MD Initiated Contact with Patient 11/09/13 1027     Chief Complaint  Patient presents with  . Chest Pain   (Consider location/radiation/quality/duration/timing/severity/associated sxs/prior Treatment) HPI Comments: 80 year old female presents with about 1-1/2 hours of chest heaviness. She feels like something is sitting on her chest. This is worst with inspiration. There is no association rest of breath at this time. No nausea, vomiting or diaphoresis. The patient has not found anything that makes it better. He took a baby aspirin this morning. She's never had pain like this before. No prior history of blood clots. She has a remote history of a mastectomy for cancer over 10 years ago. She is scheduled to see a cardiologist in one month for worsening exertional shortness of breath. She's been placed on Imdur. The patient denies any leg swelling or leg pain. The pain is currently approximately a 3/10.   History reviewed. No pertinent past medical history. Past Surgical History  Procedure Laterality Date  . Abdominal hysterectomy  2007  . Breast surgery      left masty  . Breast lumpectomy  1996    left   Family History  Problem Relation Age of Onset  . Diabetes Mother   . Diabetes Daughter   . Multiple sclerosis Son    History  Substance Use Topics  . Smoking status: Never Smoker   . Smokeless tobacco: Never Used  . Alcohol Use: 0.6 oz/week    1 Cans of beer per week   OB History   Grav Para Term Preterm Abortions TAB SAB Ect Mult Living                 Review of Systems  Constitutional: Negative for fever and chills.  Respiratory: Negative for cough and shortness of breath.   Cardiovascular: Positive for chest pain. Negative for leg swelling.  Gastrointestinal: Negative for nausea, vomiting and abdominal pain.  All other systems reviewed and are negative.    Allergies  Review of patient's allergies  indicates no known allergies.  Home Medications   Current Outpatient Rx  Name  Route  Sig  Dispense  Refill  . B-D INS SYRINGE 0.5CC/30GX1/2" 30G X 1/2" 0.5 ML MISC               . calcium citrate-vitamin D (CITRACAL+D) 315-200 MG-UNIT per tablet   Oral   Take 1 tablet by mouth 2 (two) times daily.         . Cholecalciferol (VITAMIN D-3 PO)   Oral   Take 2,000 Int'l Units by mouth 2 (two) times daily.         . COD LIVER OIL PO   Oral   Take by mouth daily.         Marland Kitchen FORA V30A BLOOD GLUCOSE TEST test strip               . HUMULIN N 100 UNIT/ML injection               . lovastatin (MEVACOR) 20 MG tablet   Oral   Take 20 mg by mouth at bedtime.         . methimazole (TAPAZOLE) 5 MG tablet   Oral   Take 5 mg by mouth daily.          . metoprolol succinate (TOPROL-XL) 100 MG 24 hr tablet   Oral   Take 100 mg by  mouth daily. Take with or immediately following a meal.         . vitamin C (ASCORBIC ACID) 500 MG tablet   Oral   Take 500 mg by mouth daily.          BP 188/94  Resp 24  SpO2 97% Physical Exam  Nursing note and vitals reviewed. Constitutional: She is oriented to person, place, and time. She appears well-developed and well-nourished.  HENT:  Head: Normocephalic and atraumatic.  Right Ear: External ear normal.  Left Ear: External ear normal.  Nose: Nose normal.  Eyes: Right eye exhibits no discharge. Left eye exhibits no discharge.  Cardiovascular: Normal rate, regular rhythm and normal heart sounds.   Pulmonary/Chest: Effort normal and breath sounds normal.  Abdominal: Soft. She exhibits no distension. There is no tenderness.  Musculoskeletal: She exhibits no edema.  Neurological: She is alert and oriented to person, place, and time.  Skin: Skin is warm and dry.    ED Course  Procedures (including critical care time) Labs Review Labs Reviewed  BASIC METABOLIC PANEL - Abnormal; Notable for the following:    Glucose, Bld  125 (*)    GFR calc non Af Amer 78 (*)    GFR calc Af Amer 90 (*)    All other components within normal limits  D-DIMER, QUANTITATIVE - Abnormal; Notable for the following:    D-Dimer, Quant 2.34 (*)    All other components within normal limits  CBC WITH DIFFERENTIAL  GLUCOSE, CAPILLARY  HEPATIC FUNCTION PANEL  TSH  PRO B NATRIURETIC PEPTIDE  TROPONIN I  TROPONIN I  TROPONIN I  HEMOGLOBIN A1C  CBC  CREATININE, SERUM  LIPID PANEL  POCT I-STAT TROPONIN I   Imaging Review Dg Chest 2 View  11/09/2013   CLINICAL DATA:  Chest pain on inspiration.  Nonsmoker.  EXAM: CHEST  2 VIEW  COMPARISON:  None.  FINDINGS: There are low lung volumes and mild patient rotation to the right. The patient's mandible partially overlies the lung apices. The heart is enlarged. Right hilar fullness is likely vascular. There is central airway thickening and mild vascular congestion. No confluent airspace opacity or significant pleural effusion is identified. A small nodular density projects over the anterior aspect of the right 3rd rib on the frontal examination, not clearly seen on the lateral view.  Surgical clips are present within the left axilla status post apparent left mastectomy. There are glenohumeral degenerative changes bilaterally.  IMPRESSION: 1. Cardiomegaly with vascular congestion. No overt edema or confluent airspace opacity. 2. Small perihilar nodular density on the right is potentially due to an overlap of vascular and osseous structures. A small pulmonary nodule cannot be excluded. Given the low lung volumes and rotation on the current frontal examination, a follow-up PA view with better inspiration and positioning is recommended to see if this is persistent.   Electronically Signed   By: Camie Patience M.D.   On: 11/09/2013 11:15   Ct Angio Chest Pe W/cm &/or Wo Cm  11/09/2013   CLINICAL DATA:  80 year old female with chest pain, chest tightness with deep inspiration. Abnormal D-dimer. Initial  encounter.  EXAM: CT ANGIOGRAPHY CHEST WITH CONTRAST  TECHNIQUE: Multidetector CT imaging of the chest was performed using the standard protocol during bolus administration of intravenous contrast. Multiplanar CT image reconstructions including MIPs were obtained to evaluate the vascular anatomy.  CONTRAST:  183mL OMNIPAQUE IOHEXOL 350 MG/ML SOLN  COMPARISON:  Chest radiographs from the same day reported separately.  FINDINGS:  Good contrast bolus timing in the pulmonary arterial tree. Enlarged central pulmonary arteries, 33 diameter main pulmonary artery versus 30 mm diameter adjacent ascending aorta. Mild respiratory motion artifact.  No focal filling defect identified in the pulmonary arterial tree to suggest the presence of acute pulmonary embolism.  Large thyroid goiter, heterogeneous confluent left lobe thyroid nodule or mass with retrosternal extension measures 60 mm diameter. Rightward deviation of the trachea at the thoracic inlet. No significant airway narrowing visualized. Major airways are patent. Dependent pulmonary atelectasis.  Cardiomegaly. No pericardial or pleural effusion. Calcified atherosclerosis of the aorta and coronary arteries. Negative visualized upper abdominal viscera. Postoperative changes to the left chest wall and axilla.  Osteopenia. Degenerative changes in the thoracic spine. No acute or suspicious osseous lesion identified.  Review of the MIP images confirms the above findings.  IMPRESSION: 1.  No evidence of acute pulmonary embolus. 2. 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 Rightward deviation of the trachea but no significant airway narrowing. 3. Pulmonary atelectasis. 4. Cardiomegaly and atherosclerosis.   Electronically Signed   By: Lars Pinks M.D.   On: 11/09/2013 14:22    EKG Interpretation    Date/Time:  Tuesday November 09 2013 10:17:30 EST Ventricular Rate:  92 PR Interval:  152 QRS Duration: 90 QT Interval:  340 QTC Calculation: 421 R  Axis:   -24 Text Interpretation:  Sinus rhythm Atrial premature complexes Probable left atrial enlargement RSR' in V1 or V2, probably normal variant Left ventricular hypertrophy Baseline wander in lead(s) V4 Nonspecific T wave abnormality No old tracing to compare Confirmed by Javanni Maring  MD, Tamkia Temples ([id number]) on 11/09/2013 10:29:25 AM            MDM   1. Diabetes mellitus without complication   2. Hypertension   3. Hyperlipidemia   4. Chest tightness   5. Dyspnea   6. Goiter    Patient stable, CP resolved with ASA, did not require nitro. PE w/u negative. No evidence of dissection. Given her progressive exertional dyspnea and now CP, feel she needs expedited w/u for cardiac etiology. Will admit to obs with hospitalist.    Ephraim Hamburger, MD 11/09/13 [phone]

## 2013-11-09 NOTE — Progress Notes (Signed)
UR completed 

## 2013-11-10 DIAGNOSIS — Z853 Personal history of malignant neoplasm of breast: Secondary | ICD-10-CM

## 2013-11-10 DIAGNOSIS — I10 Essential (primary) hypertension: Secondary | ICD-10-CM

## 2013-11-10 DIAGNOSIS — I369 Nonrheumatic tricuspid valve disorder, unspecified: Secondary | ICD-10-CM

## 2013-11-10 LAB — HEMOGLOBIN A1C
HEMOGLOBIN A1C: 7.1 % — AB (ref <5.7)
Mean Plasma Glucose: 157 mg/dL — ABNORMAL HIGH (ref <117)

## 2013-11-10 LAB — TROPONIN I: Troponin I: <0.3 ng/mL (ref <0.30)

## 2013-11-10 LAB — LIPID PANEL
Cholesterol: 173 mg/dL (ref 0–200)
HDL: 64 mg/dL (ref >39)
LDL CALC: 101 mg/dL — AB (ref 0–99)
TRIGLYCERIDES: 41 mg/dL (ref <150)
Total CHOL/HDL Ratio: 2.7 RATIO
VLDL: 8 mg/dL (ref 0–40)

## 2013-11-10 LAB — GLUCOSE, CAPILLARY
GLUCOSE-CAPILLARY: 158 mg/dL — AB (ref 70–99)
Glucose-Capillary: 105 mg/dL — ABNORMAL HIGH (ref 70–99)

## 2013-11-10 LAB — TSH: TSH: 0.224 u[IU]/mL — ABNORMAL LOW (ref 0.350–4.500)

## 2013-11-10 NOTE — Progress Notes (Signed)
  Echocardiogram 2D Echocardiogram has been performed.  Diamond Nickel 11/10/2013, 4:02 PM

## 2013-11-10 NOTE — Discharge Summary (Signed)
Physician Discharge Summary  [facility] [id number] DOB: Jul 26, 1926 DOA: 11/09/2013  PCP: Elyn Peers, MD  Admit date: 11/09/2013 Discharge date: 11/10/2013  Time spent: 35 minutes  Recommendations for Outpatient Follow-up:  1. Follow up with PCP in 1-2 weeks 2. Follow up 2D echo that was done on 11/10/13  Discharge Diagnoses:  Principal Problem:   Chest tightness Active Problems:   HX: breast cancer   Goiter   Hyperthyroidism   Diabetes mellitus without complication   Hypertension   Hyperlipidemia   Dyspnea   Discharge Condition: Stable  Diet recommendation: Diabetic  Filed Weights   11/09/13 1633  Weight: 65.8 kg (145 lb 1 oz)    History of present illness:  Gloria Wheeler is an 80 y.o. female with a PMH of HTN, DM, remote mastectomy for breast cancer, who presents to the ER with a chief complaint of the gradual onset of left chest heaviness/tightness associated with some labored breathing when she was getting ready to go on a shopping trip with her daughter. No current chest pain or tightness. Symptoms were aggravated by moving her left arm. Acknowledges that she gets anxious and this affects her breathing since her husband died 1 week ago. She is scheduled to see a cardiologist in 1 month to evaluate exertional dyspnea. The patient is a poor historian, and her daughter has stepped out of the room and is not currently available for me to interview.  Hospital Course:  The patient was admitted to the floor. Cardiac enzymes were fount to be neg x 4. A 2D echo was ordered, results to be follow up as an outpt. On further questioning, the patient reports L sided chest pain over previous mastectomy site made worse with lifting her disabled son and worse with certain arm/shoulder movements. The symptoms are therefore highly suggestive of chest wall pain, especially with serially negative troponins and an unremarkable EKG. The patient remained medically stable through the hospital  course.  Discharge Exam: Filed Vitals:   11/09/13 1633 11/09/13 2030 11/10/13 0215 11/10/13 0610  BP: 186/89 144/63 113/53 130/53  Pulse: 109 97 82 83  Temp: 98 F (36.7 C) 98.5 F (36.9 C) 98.6 F (37 C) 98.5 F (36.9 C)  TempSrc: Oral Oral Oral Oral  Resp: 20 20 20 20   Height: 5\' 5"  (1.651 m)     Weight: 65.8 kg (145 lb 1 oz)     SpO2: 98% 99% 98% 99%    General: Awake, in nad Cardiovascular: regular, s1, s2 Respiratory: normal resp effort, no wheezing  Discharge Instructions     Medication List         cholecalciferol 1000 UNITS tablet  Commonly known as:  VITAMIN D  Take 2,000 Units by mouth every morning.     HUMULIN N 100 UNIT/ML injection  Generic drug:  insulin NPH Human  Inject 10 Units into the skin every evening.     isosorbide-hydrALAZINE 20-37.5 MG per tablet  Commonly known as:  BIDIL  Take 1 tablet by mouth 2 (two) times daily.     LINZESS 145 MCG Caps capsule  Generic drug:  Linaclotide  Take 145 mcg by mouth every morning.     lovastatin 20 MG tablet  Commonly known as:  MEVACOR  Take 20 mg by mouth at bedtime.     methimazole 5 MG tablet  Commonly known as:  TAPAZOLE  Take 5 mg by mouth every morning.     metoprolol succinate 100 MG 24 hr tablet  Commonly known  as:  TOPROL-XL  Take 100 mg by mouth every morning. Take with or immediately following a meal.     vitamin C 500 MG tablet  Commonly known as:  ASCORBIC ACID  Take 500 mg by mouth every morning.       No Known Allergies Follow-up Information   Follow up with Elyn Peers, MD. Schedule an appointment as soon as possible for a visit in 1 week.   Specialty:  Family Medicine   Contact information:   [place] [id number] [phone]        The results of significant diagnostics from this hospitalization (including imaging, microbiology, ancillary and laboratory) are listed below for reference.    Significant Diagnostic Studies: Dg Chest 2  View  11/09/2013   CLINICAL DATA:  Chest pain on inspiration.  Nonsmoker.  EXAM: CHEST  2 VIEW  COMPARISON:  None.  FINDINGS: There are low lung volumes and mild patient rotation to the right. The patient's mandible partially overlies the lung apices. The heart is enlarged. Right hilar fullness is likely vascular. There is central airway thickening and mild vascular congestion. No confluent airspace opacity or significant pleural effusion is identified. A small nodular density projects over the anterior aspect of the right 3rd rib on the frontal examination, not clearly seen on the lateral view.  Surgical clips are present within the left axilla status post apparent left mastectomy. There are glenohumeral degenerative changes bilaterally.  IMPRESSION: 1. Cardiomegaly with vascular congestion. No overt edema or confluent airspace opacity. 2. Small perihilar nodular density on the right is potentially due to an overlap of vascular and osseous structures. A small pulmonary nodule cannot be excluded. Given the low lung volumes and rotation on the current frontal examination, a follow-up PA view with better inspiration and positioning is recommended to see if this is persistent.   Electronically Signed   By: Camie Patience M.D.   On: 11/09/2013 11:15   Ct Angio Chest Pe W/cm &/or Wo Cm  11/09/2013   CLINICAL DATA:  80 year old female with chest pain, chest tightness with deep inspiration. Abnormal D-dimer. Initial encounter.  EXAM: CT ANGIOGRAPHY CHEST WITH CONTRAST  TECHNIQUE: Multidetector CT imaging of the chest was performed using the standard protocol during bolus administration of intravenous contrast. Multiplanar CT image reconstructions including MIPs were obtained to evaluate the vascular anatomy.  CONTRAST:  161mL OMNIPAQUE IOHEXOL 350 MG/ML SOLN  COMPARISON:  Chest radiographs from the same day reported separately.  FINDINGS: Good contrast bolus timing in the pulmonary arterial tree. Enlarged central pulmonary  arteries, 33 diameter main pulmonary artery versus 30 mm diameter adjacent ascending aorta. Mild respiratory motion artifact.  No focal filling defect identified in the pulmonary arterial tree to suggest the presence of acute pulmonary embolism.  Large thyroid goiter, heterogeneous confluent left lobe thyroid nodule or mass with retrosternal extension measures 60 mm diameter. Rightward deviation of the trachea at the thoracic inlet. No significant airway narrowing visualized. Major airways are patent. Dependent pulmonary atelectasis.  Cardiomegaly. No pericardial or pleural effusion. Calcified atherosclerosis of the aorta and coronary arteries. Negative visualized upper abdominal viscera. Postoperative changes to the left chest wall and axilla.  Osteopenia. Degenerative changes in the thoracic spine. No acute or suspicious osseous lesion identified.  Review of the MIP images confirms the above findings.  IMPRESSION: 1.  No evidence of acute pulmonary embolus. 2. 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 Rightward deviation of the trachea but no significant airway  narrowing. 3. Pulmonary atelectasis. 4. Cardiomegaly and atherosclerosis.   Electronically Signed   By: Lars Pinks M.D.   On: 11/09/2013 14:22    Microbiology: No results found for this or any previous visit (from the past 240 hour(s)).   Labs: Basic Metabolic Panel:  Recent Labs Lab 11/09/13 1215  NA 140  K 4.7  CL 101  CO2 24  GLUCOSE 125*  BUN 14  CREATININE 0.68  CALCIUM 9.6   Liver Function Tests:  Recent Labs Lab 11/09/13 1718  AST 36  ALT 17  ALKPHOS 70  BILITOT 0.4  PROT 7.7  ALBUMIN 3.7   No results found for this basename: LIPASE, AMYLASE,  in the last 168 hours No results found for this basename: AMMONIA,  in the last 168 hours CBC:  Recent Labs Lab 11/09/13 1215  WBC 8.3  NEUTROABS 6.4  HGB 13.1  HCT 38.9  MCV 86.4  PLT 173   Cardiac Enzymes:  Recent Labs Lab 11/09/13 1718  11/09/13 2336 11/10/13 0516  TROPONINI <0.30 <0.30 <0.30   BNP: BNP (last 3 results)  Recent Labs  11/09/13 1215  PROBNP 1084.0*   CBG:  Recent Labs Lab 11/09/13 1346 11/09/13 2147 11/10/13 0718 11/10/13 1150  GLUCAP 92 230* 105* 158*   Signed:  CHIU, STEPHEN K  [facility] 11/10/2013, 4:14 PM

## 2013-11-11 ENCOUNTER — Ambulatory Visit: Payer: Self-pay | Admitting: Podiatry

## 2013-11-11 NOTE — Care Management Note (Signed)
    Page 1 of 1   11/11/2013     12:10:24 PM   CARE MANAGEMENT NOTE 11/11/2013  Patient:  Gloria Wheeler, Gloria Wheeler   Account Number:  [id number]  Date Initiated:  11/11/2013  Documentation initiated by:  Dessa Phi  Subjective/Objective Assessment:   80 Y/O F ADMITTED W/CHEST PAIN.     Action/Plan:   FROM HOME.   Anticipated DC Date:  11/10/2013   Anticipated DC Plan:  [place]  CM consult      Choice offered to / List presented to:             Status of service:  Completed, signed off Medicare Important Message given?   (If response is "NO", the following Medicare IM given date fields will be blank) Date Medicare IM given:   Date Additional Medicare IM given:    Discharge Disposition:  HOME/SELF CARE  Per UR Regulation:  Reviewed for med. necessity/level of care/duration of stay  If discussed at [place] of Stay Meetings, dates discussed:    Comments:

## 2013-12-13 ENCOUNTER — Other Ambulatory Visit: Payer: Self-pay | Admitting: Family Medicine

## 2013-12-13 DIAGNOSIS — Z853 Personal history of malignant neoplasm of breast: Secondary | ICD-10-CM

## 2013-12-13 DIAGNOSIS — N63 Unspecified lump in unspecified breast: Secondary | ICD-10-CM

## 2013-12-13 DIAGNOSIS — Z9012 Acquired absence of left breast and nipple: Secondary | ICD-10-CM

## 2014-01-20 ENCOUNTER — Ambulatory Visit
Admission: RE | Admit: 2014-01-20 | Discharge: 2014-01-20 | Disposition: A | Discharge status: Home / Self Care | Payer: Medicare Other | Source: Ambulatory Visit | Attending: Family Medicine | Admitting: Family Medicine

## 2014-01-20 DIAGNOSIS — N63 Unspecified lump in unspecified breast: Secondary | ICD-10-CM

## 2014-01-20 DIAGNOSIS — Z9012 Acquired absence of left breast and nipple: Secondary | ICD-10-CM

## 2014-06-28 ENCOUNTER — Other Ambulatory Visit: Payer: Self-pay

## 2014-06-28 DIAGNOSIS — Z1231 Encounter for screening mammogram for malignant neoplasm of breast: Secondary | ICD-10-CM

## 2014-06-28 DIAGNOSIS — Z9012 Acquired absence of left breast and nipple: Secondary | ICD-10-CM

## 2014-07-08 ENCOUNTER — Telehealth: Payer: Self-pay | Admitting: *Deleted

## 2014-07-08 NOTE — Telephone Encounter (Signed)
Dani left me a message on my voicemail stating that she needs to get the pt scheduled w/ a new provider since Dr. Aleene Davidson is gone.  Called and left a message for her to return my call so I can schedule the pt.

## 2014-07-28 ENCOUNTER — Ambulatory Visit
Admission: RE | Admit: 2014-07-28 | Discharge: 2014-07-28 | Disposition: A | Discharge status: Home / Self Care | Payer: Medicare Other | Source: Ambulatory Visit

## 2014-07-28 DIAGNOSIS — Z9012 Acquired absence of left breast and nipple: Secondary | ICD-10-CM

## 2014-07-28 DIAGNOSIS — Z1231 Encounter for screening mammogram for malignant neoplasm of breast: Secondary | ICD-10-CM

## 2014-12-13 DIAGNOSIS — E0590 Thyrotoxicosis, unspecified without thyrotoxic crisis or storm: Secondary | ICD-10-CM | POA: Diagnosis not present

## 2014-12-13 DIAGNOSIS — I10 Essential (primary) hypertension: Secondary | ICD-10-CM | POA: Diagnosis not present

## 2014-12-13 DIAGNOSIS — M159 Polyosteoarthritis, unspecified: Secondary | ICD-10-CM | POA: Diagnosis not present

## 2014-12-13 DIAGNOSIS — Z9012 Acquired absence of left breast and nipple: Secondary | ICD-10-CM | POA: Diagnosis not present

## 2014-12-13 DIAGNOSIS — E780 Pure hypercholesterolemia: Secondary | ICD-10-CM | POA: Diagnosis not present

## 2014-12-13 DIAGNOSIS — E785 Hyperlipidemia, unspecified: Secondary | ICD-10-CM | POA: Diagnosis not present

## 2014-12-13 DIAGNOSIS — E559 Vitamin D deficiency, unspecified: Secondary | ICD-10-CM | POA: Diagnosis not present

## 2014-12-13 DIAGNOSIS — E1122 Type 2 diabetes mellitus with diabetic chronic kidney disease: Secondary | ICD-10-CM | POA: Diagnosis not present

## 2014-12-13 DIAGNOSIS — E1165 Type 2 diabetes mellitus with hyperglycemia: Secondary | ICD-10-CM | POA: Diagnosis not present

## 2015-01-12 ENCOUNTER — Emergency Department (HOSPITAL_COMMUNITY): Payer: Medicare Other

## 2015-01-12 ENCOUNTER — Observation Stay (HOSPITAL_COMMUNITY)
Admission: EM | Admit: 2015-01-12 | Discharge: 2015-01-13 | Disposition: A | Discharge status: Home / Self Care | Payer: Medicare Other | Attending: Internal Medicine | Admitting: Internal Medicine

## 2015-01-12 ENCOUNTER — Encounter (HOSPITAL_COMMUNITY): Payer: Self-pay

## 2015-01-12 DIAGNOSIS — Z90710 Acquired absence of both cervix and uterus: Secondary | ICD-10-CM | POA: Diagnosis not present

## 2015-01-12 DIAGNOSIS — R42 Dizziness and giddiness: Secondary | ICD-10-CM | POA: Diagnosis not present

## 2015-01-12 DIAGNOSIS — F439 Reaction to severe stress, unspecified: Secondary | ICD-10-CM

## 2015-01-12 DIAGNOSIS — E0590 Thyrotoxicosis, unspecified without thyrotoxic crisis or storm: Secondary | ICD-10-CM | POA: Diagnosis present

## 2015-01-12 DIAGNOSIS — Z853 Personal history of malignant neoplasm of breast: Secondary | ICD-10-CM | POA: Diagnosis not present

## 2015-01-12 DIAGNOSIS — F1099 Alcohol use, unspecified with unspecified alcohol-induced disorder: Secondary | ICD-10-CM | POA: Insufficient documentation

## 2015-01-12 DIAGNOSIS — E785 Hyperlipidemia, unspecified: Secondary | ICD-10-CM | POA: Diagnosis present

## 2015-01-12 DIAGNOSIS — R51 Headache: Secondary | ICD-10-CM | POA: Diagnosis not present

## 2015-01-12 DIAGNOSIS — E119 Type 2 diabetes mellitus without complications: Secondary | ICD-10-CM | POA: Diagnosis not present

## 2015-01-12 DIAGNOSIS — Z9012 Acquired absence of left breast and nipple: Secondary | ICD-10-CM | POA: Insufficient documentation

## 2015-01-12 DIAGNOSIS — I10 Essential (primary) hypertension: Secondary | ICD-10-CM | POA: Diagnosis not present

## 2015-01-12 DIAGNOSIS — Z794 Long term (current) use of insulin: Secondary | ICD-10-CM | POA: Insufficient documentation

## 2015-01-12 DIAGNOSIS — Z638 Other specified problems related to primary support group: Secondary | ICD-10-CM

## 2015-01-12 DIAGNOSIS — I951 Orthostatic hypotension: Secondary | ICD-10-CM | POA: Diagnosis not present

## 2015-01-12 HISTORY — DX: Other constipation: K59.09

## 2015-01-12 HISTORY — DX: Nontoxic goiter, unspecified: E04.9

## 2015-01-12 LAB — CBC WITH DIFFERENTIAL/PLATELET
Basophils Absolute: 0 10*3/uL (ref 0.0–0.1)
Basophils Relative: 0 % (ref 0–1)
Eosinophils Absolute: 0 10*3/uL (ref 0.0–0.7)
Eosinophils Relative: 1 % (ref 0–5)
HCT: 41.2 % (ref 36.0–46.0)
Hemoglobin: 14 g/dL (ref 12.0–15.0)
Lymphocytes Relative: 22 % (ref 12–46)
Lymphs Abs: 1.5 10*3/uL (ref 0.7–4.0)
MCH: 29.9 pg (ref 26.0–34.0)
MCHC: 34 g/dL (ref 30.0–36.0)
MCV: 87.8 fL (ref 78.0–100.0)
Monocytes Absolute: 0.4 10*3/uL (ref 0.1–1.0)
Monocytes Relative: 6 % (ref 3–12)
Neutro Abs: 4.8 10*3/uL (ref 1.7–7.7)
Neutrophils Relative %: 71 % (ref 43–77)
Platelets: 181 10*3/uL (ref 150–400)
RBC: 4.69 MIL/uL (ref 3.87–5.11)
RDW: 14.3 % (ref 11.5–15.5)
WBC: 6.7 10*3/uL (ref 4.0–10.5)

## 2015-01-12 LAB — BASIC METABOLIC PANEL
Anion gap: 8 (ref 5–15)
BUN: 13 mg/dL (ref 6–23)
CHLORIDE: 103 mmol/L (ref 96–112)
CO2: 28 mmol/L (ref 19–32)
Calcium: 9.7 mg/dL (ref 8.4–10.5)
Creatinine, Ser: 0.69 mg/dL (ref 0.50–1.10)
GFR calc Af Amer: 89 mL/min — ABNORMAL LOW (ref >90)
GFR calc non Af Amer: 77 mL/min — ABNORMAL LOW (ref >90)
Glucose, Bld: 149 mg/dL — ABNORMAL HIGH (ref 70–99)
Potassium: 3.8 mmol/L (ref 3.5–5.1)
Sodium: 139 mmol/L (ref 135–145)

## 2015-01-12 LAB — GLUCOSE, CAPILLARY
Glucose-Capillary: 76 mg/dL (ref 70–99)
Glucose-Capillary: 158 mg/dL — ABNORMAL HIGH (ref 70–99)

## 2015-01-12 LAB — URINALYSIS, ROUTINE W REFLEX MICROSCOPIC
Bilirubin Urine: NEGATIVE
Glucose, UA: NEGATIVE mg/dL
Hgb urine dipstick: NEGATIVE
Ketones, ur: NEGATIVE mg/dL
Leukocytes, UA: NEGATIVE
Nitrite: NEGATIVE
Protein, ur: NEGATIVE mg/dL
Specific Gravity, Urine: 1.007 (ref 1.005–1.030)
Urobilinogen, UA: 1 mg/dL (ref 0.0–1.0)
pH: 7.5 (ref 5.0–8.0)

## 2015-01-12 LAB — CBG MONITORING, ED: Glucose-Capillary: 142 mg/dL — ABNORMAL HIGH (ref 70–99)

## 2015-01-12 MED ORDER — METHIMAZOLE 5 MG PO TABS
5.0000 mg | ORAL_TABLET | Freq: Every morning | ORAL | Status: DC
  Administered 2015-01-13: 5 mg via ORAL
  Filled 2015-01-12: qty 1

## 2015-01-12 MED ORDER — LINACLOTIDE 145 MCG PO CAPS
145.0000 ug | ORAL_CAPSULE | Freq: Every morning | ORAL | Status: DC
  Administered 2015-01-13: 145 ug via ORAL
  Filled 2015-01-12: qty 1

## 2015-01-12 MED ORDER — ACETAMINOPHEN 650 MG RE SUPP: 650.0000 mg | Freq: Four times a day (QID) | RECTAL | Status: DC | PRN

## 2015-01-12 MED ORDER — PRAVASTATIN SODIUM 20 MG PO TABS
20.0000 mg | ORAL_TABLET | Freq: Every day | ORAL | Status: DC
  Filled 2015-01-12 (×2): qty 1

## 2015-01-12 MED ORDER — SODIUM CHLORIDE 0.9 % IV BOLUS (SEPSIS)
500.0000 mL | Freq: Once | INTRAVENOUS | Status: AC
  Administered 2015-01-12: 500 mL via INTRAVENOUS

## 2015-01-12 MED ORDER — SODIUM CHLORIDE 0.9 % IV SOLN
INTRAVENOUS | Status: DC
  Administered 2015-01-12 – 2015-01-13 (×3): via INTRAVENOUS

## 2015-01-12 MED ORDER — ACETAMINOPHEN 325 MG PO TABS: 650.0000 mg | ORAL_TABLET | Freq: Four times a day (QID) | ORAL | Status: DC | PRN

## 2015-01-12 MED ORDER — SODIUM CHLORIDE 0.9 % IV SOLN: INTRAVENOUS | Status: DC

## 2015-01-12 MED ORDER — POLYETHYLENE GLYCOL 3350 17 G PO PACK: 17.0000 g | PACK | Freq: Every day | ORAL | Status: DC | PRN

## 2015-01-12 MED ORDER — HEPARIN SODIUM (PORCINE) 5000 UNIT/ML IJ SOLN
5000.0000 [IU] | Freq: Three times a day (TID) | INTRAMUSCULAR | Status: DC
  Administered 2015-01-12 – 2015-01-13 (×2): 5000 [IU] via SUBCUTANEOUS
  Filled 2015-01-12 (×5): qty 1

## 2015-01-12 MED ORDER — ALUM & MAG HYDROXIDE-SIMETH 200-200-20 MG/5ML PO SUSP: 30.0000 mL | Freq: Four times a day (QID) | ORAL | Status: DC | PRN

## 2015-01-12 MED ORDER — ONDANSETRON HCL 4 MG/2ML IJ SOLN: 4.0000 mg | Freq: Four times a day (QID) | INTRAMUSCULAR | Status: DC | PRN

## 2015-01-12 MED ORDER — GLUCERNA SHAKE PO LIQD
237.0000 mL | Freq: Three times a day (TID) | ORAL | Status: DC
  Administered 2015-01-12 – 2015-01-13 (×2): 237 mL via ORAL
  Filled 2015-01-12 (×4): qty 237

## 2015-01-12 MED ORDER — INSULIN ASPART 100 UNIT/ML ~~LOC~~ SOLN
0.0000 [IU] | Freq: Three times a day (TID) | SUBCUTANEOUS | Status: DC
  Administered 2015-01-13: 3 [IU] via SUBCUTANEOUS

## 2015-01-12 MED ORDER — ONDANSETRON HCL 4 MG PO TABS: 4.0000 mg | ORAL_TABLET | Freq: Four times a day (QID) | ORAL | Status: DC | PRN

## 2015-01-12 MED ORDER — INSULIN ASPART 100 UNIT/ML ~~LOC~~ SOLN: 0.0000 [IU] | Freq: Every day | SUBCUTANEOUS | Status: DC

## 2015-01-12 MED ORDER — VITAMIN C 500 MG PO TABS
500.0000 mg | ORAL_TABLET | Freq: Every morning | ORAL | Status: DC
  Administered 2015-01-13: 500 mg via ORAL
  Filled 2015-01-12: qty 1

## 2015-01-12 NOTE — ED Provider Notes (Signed)
CSN: [id number]     Arrival date & time 01/12/15  0865 History   First MD Initiated Contact with Patient 01/12/15 [phone]     Chief Complaint  Patient presents with  . Dizziness     (Consider location/radiation/quality/duration/timing/severity/associated sxs/prior Treatment) HPI Comments: Patient presents to the ER for evaluation of dizziness. Patient reports that she woke this morning and felt dizzy. She didn't notice it until she went to the bathroom. She reports she went back into her bed and still felt dizzy so she got up and leave. She started to feel nausea but did not vomit. There was no headache or blurred vision. Patient denies chest pain, shortness of breath, palpitations. She has not had any vomiting or diarrhea. Patient reports that her symptoms are much improved now. She has never had similar symptoms.  Patient is a 81 y.o. female presenting with dizziness.  Dizziness   Past Medical History  Diagnosis Date  . lt breast ca w/ recurrence dx'd 1993 and 2006    lumpectomy and then masectomy  . Diabetes mellitus without complication   . Hypertension   . Hyperlipidemia   . Hyperthyroidism    Past Surgical History  Procedure Laterality Date  . Abdominal hysterectomy  2007  . Mastectomy Left   . Breast lumpectomy  1996    left   Family History  Problem Relation Age of Onset  . Diabetes Mother   . Diabetes Daughter   . Multiple sclerosis Son    History  Substance Use Topics  . Smoking status: Never Smoker   . Smokeless tobacco: Never Used  . Alcohol Use: 0.6 oz/week    1 Cans of beer per week   OB History    No data available     Review of Systems  Neurological: Positive for dizziness.  All other systems reviewed and are negative.     Allergies  Review of patient's allergies indicates no known allergies.  Home Medications   Prior to Admission medications   Medication Sig Start Date End Date Taking? Authorizing Provider  COD LIVER OIL PO Take 1 tablet by  mouth daily.   Yes Historical Provider, MD  HUMULIN N 100 UNIT/ML injection Inject 10 Units into the skin every evening.  12/13/12  Yes Historical Provider, MD  Linaclotide Rolan Lipa) 145 MCG CAPS capsule Take 145 mcg by mouth every morning.   Yes Historical Provider, MD  methimazole (TAPAZOLE) 5 MG tablet Take 5 mg by mouth every morning.  11/11/12  Yes Historical Provider, MD  metoprolol succinate (TOPROL-XL) 100 MG 24 hr tablet Take 100 mg by mouth every morning. Take with or immediately following a meal.   Yes Historical Provider, MD  vitamin C (ASCORBIC ACID) 500 MG tablet Take 500 mg by mouth every morning.    Yes Historical Provider, MD  lovastatin (MEVACOR) 20 MG tablet Take 20 mg by mouth at bedtime.    Historical Provider, MD   BP 176/75 mmHg  Pulse 87  Temp(Src) 97.9 F (36.6 C) (Oral)  Resp 23  Ht 5\' 4"  (1.626 m)  Wt 149 lb (67.586 kg)  BMI 25.56 kg/m2  SpO2 97% Physical Exam  Constitutional: She is oriented to person, place, and time. She appears well-developed and well-nourished. No distress.  HENT:  Head: Normocephalic and atraumatic.  Right Ear: Hearing normal.  Left Ear: Hearing normal.  Nose: Nose normal.  Mouth/Throat: Oropharynx is clear and moist and mucous membranes are normal.  Eyes: Conjunctivae and EOM are normal. Pupils are  equal, round, and reactive to light.  Neck: Normal range of motion. Neck supple.  Cardiovascular: Regular rhythm, S1 normal and S2 normal.  Exam reveals no gallop and no friction rub.   No murmur heard. Pulmonary/Chest: Effort normal and breath sounds normal. No respiratory distress. She exhibits no tenderness.  Abdominal: Soft. Normal appearance and bowel sounds are normal. There is no hepatosplenomegaly. There is no tenderness. There is no rebound, no guarding, no tenderness at McBurney's point and negative Murphy's sign. No hernia.  Musculoskeletal: Normal range of motion.  Neurological: She is alert and oriented to person, place, and time.  She has normal strength. No cranial nerve deficit or sensory deficit. Coordination normal. GCS eye subscore is 4. GCS verbal subscore is 5. GCS motor subscore is 6.  Extraocular muscle movement: normal No visual field cut Pupils: equal and reactive both direct and consensual response is normal No nystagmus present    Sensory function is intact to light touch, pinprick Proprioception intact  Grip strength 5/5 symmetric in upper extremities No pronator drift Normal finger to nose bilaterally  Lower extremity strength 5/5 against gravity Normal heel to shin bilaterally  Gait: normal Rhomberg: Normal  Skin: Skin is warm, dry and intact. No rash noted. No cyanosis.  Psychiatric: She has a normal mood and affect. Her speech is normal and behavior is normal. Thought content normal.  Nursing note and vitals reviewed.   ED Course  Procedures (including critical care time) Labs Review Labs Reviewed  BASIC METABOLIC PANEL - Abnormal; Notable for the following:    Glucose, Bld 149 (*)    GFR calc non Af Amer 77 (*)    GFR calc Af Amer 89 (*)    All other components within normal limits  CBG MONITORING, ED - Abnormal; Notable for the following:    Glucose-Capillary 142 (*)    All other components within normal limits  CBC WITH DIFFERENTIAL/PLATELET  URINALYSIS, ROUTINE W REFLEX MICROSCOPIC  CBG MONITORING, ED    Imaging Review Ct Head Wo Contrast  01/12/2015   CLINICAL DATA:  Headache, dizziness, bilateral lower extremity weakness  EXAM: CT HEAD WITHOUT CONTRAST  TECHNIQUE: Contiguous axial images were obtained from the base of the skull through the vertex without intravenous contrast.  COMPARISON:  None.  FINDINGS: No skull fracture is noted. Paranasal sinuses and mastoid air cells are unremarkable. No intracranial hemorrhage, mass effect or midline shift. There is mild cerebral atrophy. Periventricular and patchy subcortical white matter decreased attenuation probable due to chronic  small vessel ischemic changes. Atherosclerotic calcifications of carotid siphon.  No acute cortical infarction. No mass lesion is noted on this unenhanced scan.  IMPRESSION: No acute intracranial abnormality. Mild cerebral atrophy. Periventricular and patchy subcortical white matter decreased attenuation probable due to chronic small vessel ischemic changes.   Electronically Signed   By: Lahoma Crocker M.D.   On: 01/12/2015 10:54     EKG Interpretation   Date/Time:  Thursday January 12 2015 09:54:59 EST Ventricular Rate:  131 PR Interval:  144 QRS Duration: 84 QT Interval:  321 QTC Calculation: 474 R Axis:   -26 Text Interpretation:  Sinus tachycardia Atrial premature complex LVH with  secondary repolarization abnormality Baseline wander in lead(s) V3 No  significant change since last tracing other than tachycardia Confirmed by  Raequan Vanschaick  MD, Trigo Winterbottom [phone]) on 01/12/2015 11:23:10 AM      MDM   Final diagnoses:  Dizziness  Orthostasis    Patient presents to the ER for evaluation of  dizziness. Patient reports onset of dizziness this morning upon awakening. She cannot describe the sensation. Symptoms are improved here in the ER. She has a normal neurologic exam upon evaluation. CT head was unremarkable. Lab work was unremarkable. Patient did have mild orthostasis with increased heart rate with standing. She was administered IV fluids. Recheck after 500 mL fluid bolus reveals worsening orthostasis. Her blood pressure actually goes up with her heart rate, rather than dropping, has not had any syncope. She will, however, require further management of orthostasis- will ask hospitalist to admit.    Orpah Greek, MD 01/12/15 [phone]

## 2015-01-12 NOTE — ED Notes (Signed)
Pt states she woke this am and was very dizzy and nauseated

## 2015-01-12 NOTE — ED Notes (Signed)
Pt reports that she woke this morning w/ BLE weakness and a headache, as well as the dizziness.

## 2015-01-12 NOTE — H&P (Signed)
History and Physical:    Gloria Wheeler OYD:[id number] DOB: 1925/12/31 DOA: 01/12/2015  Referring physician: Dr. Joseph Berkshire PCP: Elyn Peers, MD   Chief Complaint: Dizziness  History of Present Illness:   Gloria Wheeler is an 81 y.o. female with a PMH of HTN, hyperthyroidism and hyperlipidemia who presented with dizziness that started last night.  She felt worse when she laid down, described it as a woozy feeling that kept her awake.  She thought she might be hungry, but did not have an appetite to eat.  She was restless in the night, but felt a bit better when she was able to sleep.  The patient's daughter reported that the patient was having nausea/dry heaves and ongoing dizziness, so her daughter brought her to the ED for further evaluation.  She also reports feeling a bit weak.  She lives with her handicapped son, and is his primary caretaker.  No associated fever or chills, but reports some cold intolerance.  Her daughter reports that her sugars have been running in the high 200's and she thinks her mother may have missed some of her doses of insulin.  The patient tells me she has been under stress lately, and feels unsafe in her home. There may be a little paranoia in that she thinks others are stealing from her. Upon initial evaluation in the ED, the patient was noted to be orthostatic and therefore was referred for inpatient evaluation.  ROS:   Constitutional: No fever, no chills;  Appetite diminished; No weight loss, no weight gain, + fatigue.  HEENT: No blurry vision, no diplopia, no pharyngitis, no dysphagia CV: No chest pain, no palpitations, no PND, no orthopnea, no edema.  Resp: No SOB, no cough, no pleuritic pain. GI: + nausea, no vomiting, no diarrhea, no melena, no hematochezia, + constipation, no abdominal pain.  GU: No dysuria, no hematuria, no frequency, no urgency. MSK: no myalgias, no arthralgias.  Neuro:  No headache, no focal neurological deficits, no history of  seizures, +dizziness.  Psych: No depression, no anxiety.  Endo: No heat intolerance, no cold intolerance, + polyuria, no polydipsia  Skin: No rashes, no skin lesions.  Heme: No easy bruising.  Travel history: No recent travel.   Past Medical History:   Past Medical History  Diagnosis Date  . lt breast ca w/ recurrence dx'd 1993 and 2006    lumpectomy and then masectomy  . Diabetes mellitus without complication   . Hypertension   . Hyperlipidemia   . Hyperthyroidism   . Chronic constipation   . Goiter 11/09/2013    Past Surgical History:   Past Surgical History  Procedure Laterality Date  . Abdominal hysterectomy  2007  . Mastectomy Left   . Breast lumpectomy  1996    left    Social History:   History   Social History  . Marital Status: Married    Spouse Name: N/A  . Number of Children: 2  . Years of Education: N/A   Occupational History  . Retired    Social History Main Topics  . Smoking status: Never Smoker   . Smokeless tobacco: Never Used  . Alcohol Use: 0.6 oz/week    1 Cans of beer per week  . Drug Use: No  . Sexual Activity: Not Currently   Other Topics Concern  . Not on file   Social History Narrative   Recently widowed (1/15).  Previously married for 69 years.  Independent of ADLs.  Ambulates independently.  Lives  with handicapped son.    Family history:   Family History  Problem Relation Age of Onset  . Diabetes Mother   . Diabetes Daughter   . Multiple sclerosis Son     Allergies   Review of patient's allergies indicates no known allergies.  Current Medications:   Prior to Admission medications   Medication Sig Start Date End Date Taking? Authorizing Provider  COD LIVER OIL PO Take 1 tablet by mouth daily.   Yes Historical Provider, MD  HUMULIN N 100 UNIT/ML injection Inject 10 Units into the skin every evening.  12/13/12  Yes Historical Provider, MD  Linaclotide Rolan Lipa) 145 MCG CAPS capsule Take 145 mcg by mouth every morning.   Yes  Historical Provider, MD  methimazole (TAPAZOLE) 5 MG tablet Take 5 mg by mouth every morning.  11/11/12  Yes Historical Provider, MD  metoprolol succinate (TOPROL-XL) 100 MG 24 hr tablet Take 100 mg by mouth every morning. Take with or immediately following a meal.   Yes Historical Provider, MD  vitamin C (ASCORBIC ACID) 500 MG tablet Take 500 mg by mouth every morning.    Yes Historical Provider, MD  lovastatin (MEVACOR) 20 MG tablet Take 20 mg by mouth at bedtime.    Historical Provider, MD    Physical Exam:   Filed Vitals:   01/12/15 1104 01/12/15 1151 01/12/15 1446 01/12/15 1525  BP: 176/82 155/66 176/75 152/75  Pulse: 102 105 87 103  Temp:      TempSrc:      Resp: 16 18 23 18   Height:      Weight:      SpO2: 100% 98% 97%      Physical Exam: Blood pressure 152/75, pulse 103, temperature 97.9 F (36.6 C), temperature source Oral, resp. rate 18, height 5\' 4"  (1.626 m), weight 67.586 kg (149 lb), SpO2 97 %. Gen: No acute distress. Head: Normocephalic, atraumatic. Eyes: PERRL, EOMI, sclerae nonicteric. Mouth: Oropharynx clear. No posterior pharyngeal erythema or exudates. Neck: Supple, no thyromegaly, no lymphadenopathy, no jugular venous distention. Chest: Lungs clear to auscultation bilaterally with good air movement. CV: Heart sounds are regular, mildly tachycardic. No murmurs, rubs, or gallops. Abdomen: Soft, nontender, nondistended with normal active bowel sounds. Extremities: Extremities are without clubbing, edema, or cyanosis. Skin: Warm and dry. Neuro: Alert and oriented times 3; cranial nerves II through XII grossly intact. Psych: Mood and affect angry/irritable.   Data Review:    Labs: Basic Metabolic Panel:  Recent Labs Lab 01/12/15 1022  NA 139  K 3.8  CL 103  CO2 28  GLUCOSE 149*  BUN 13  CREATININE 0.69  CALCIUM 9.7   CBC:  Recent Labs Lab 01/12/15 1022  WBC 6.7  NEUTROABS 4.8  HGB 14.0  HCT 41.2  MCV 87.8  PLT 181   CBG:  Recent  Labs Lab 01/12/15 0901  GLUCAP 142*    Radiographic Studies: Ct Head Wo Contrast  01/12/2015   CLINICAL DATA:  Headache, dizziness, bilateral lower extremity weakness  EXAM: CT HEAD WITHOUT CONTRAST  TECHNIQUE: Contiguous axial images were obtained from the base of the skull through the vertex without intravenous contrast.  COMPARISON:  None.  FINDINGS: No skull fracture is noted. Paranasal sinuses and mastoid air cells are unremarkable. No intracranial hemorrhage, mass effect or midline shift. There is mild cerebral atrophy. Periventricular and patchy subcortical white matter decreased attenuation probable due to chronic small vessel ischemic changes. Atherosclerotic calcifications of carotid siphon.  No acute cortical infarction. No mass lesion  is noted on this unenhanced scan.  IMPRESSION: No acute intracranial abnormality. Mild cerebral atrophy. Periventricular and patchy subcortical white matter decreased attenuation probable due to chronic small vessel ischemic changes.   Electronically Signed   By: Lahoma Crocker M.D.   On: 01/12/2015 10:54    EKG: Independently reviewed. Sinus tachycardia with premature atrial complexes.   Assessment/Plan:   Principal Problem:   Orthostasis - Likely related to osmotic diuresis in the setting of ongoing antihypertensive use. - Hold antihypertensives and gently hydrate. - Start on SSI. - PT evaluation.  Active Problems:   Hyperthyroidism - Continue Tapazole. Hold metoprolol until blood pressure improves.    Diabetes mellitus without complication - Hemoglobin A1c 7. 04/2014. - Use SSI, moderate scale for now.    Hypertension - Resume metoprolol in the morning if her blood pressure stable.    Hyperlipidemia - Resume Mevacor.    Stress at home - Social worker consulted. Will likely need a home social work Human resources officer.    DVT prophylaxis - Heparin ordered.  Code Status: Full. Family Communication: Gloria Wheeler (daughter),  [phone]. Disposition Plan: Home when stable.  Time spent: 60 minutes.  RAMA,CHRISTINA [facility] Pager [phone] Cell: [phone]   If 7PM-7AM, please contact night-coverage [email or web address] Password [email or web address] 01/12/2015, 4:03 PM

## 2015-01-13 DIAGNOSIS — E119 Type 2 diabetes mellitus without complications: Secondary | ICD-10-CM | POA: Diagnosis not present

## 2015-01-13 DIAGNOSIS — I10 Essential (primary) hypertension: Secondary | ICD-10-CM | POA: Diagnosis not present

## 2015-01-13 DIAGNOSIS — E785 Hyperlipidemia, unspecified: Secondary | ICD-10-CM | POA: Diagnosis not present

## 2015-01-13 DIAGNOSIS — I951 Orthostatic hypotension: Secondary | ICD-10-CM | POA: Diagnosis not present

## 2015-01-13 DIAGNOSIS — E0590 Thyrotoxicosis, unspecified without thyrotoxic crisis or storm: Secondary | ICD-10-CM | POA: Diagnosis not present

## 2015-01-13 LAB — GLUCOSE, CAPILLARY
GLUCOSE-CAPILLARY: 141 mg/dL — AB (ref 70–99)
GLUCOSE-CAPILLARY: 237 mg/dL — AB (ref 70–99)

## 2015-01-13 MED ORDER — METOPROLOL SUCCINATE ER 100 MG PO TB24
100.0000 mg | ORAL_TABLET | Freq: Every day | ORAL | Status: DC
  Administered 2015-01-13: 100 mg via ORAL
  Filled 2015-01-13 (×2): qty 1

## 2015-01-13 MED ORDER — GLUCERNA SHAKE PO LIQD: 237.0000 mL | Freq: Three times a day (TID) | ORAL | Status: AC

## 2015-01-13 NOTE — Progress Notes (Signed)
Progress Note   [name] GUY:[id number] DOB: [birth year] DOA: 01/12/2015 PCP: Elyn Peers, MD   Brief Narrative:   Gloria Wheeler is an 81 y.o. female with a PMH of HTN, hyperthyroidism and hyperlipidemia who was admitted 01/12/15 with chief complaint of dizziness. She was found to be orthostatic on admission, which was felt to be from volume depletion in the setting of osmotic diuresis from recent hyperglycemia.  Assessment/Plan:   Principal Problem:  Orthostasis - Likely related to osmotic diuresis in the setting of ongoing antihypertensive use. - PT evaluation requested.  Still pending.  Active Problems:  Hyperthyroidism - Continue Tapazole.    Diabetes mellitus without complication - Hemoglobin A1c 7%  04/2014. - CBGs 76-158 on moderate scale SSI.    Hypertension - Resume metoprolol.   Hyperlipidemia - Resume Mevacor.   Stress at home - Social worker consulted. Will need a home social work Human resources officer.   DVT prophylaxis - Heparin ordered.  Code Status: Full. Family Communication: Malaia Buchta (daughter), [phone]. Disposition Plan: Home when stable.   IV Access:    Peripheral IV   Procedures and diagnostic studies:   Ct Head Wo Contrast 01/12/2015: No acute intracranial abnormality. Mild cerebral atrophy. Periventricular and patchy subcortical white matter decreased attenuation probable due to chronic small vessel ischemic changes.     Medical Consultants:    None.  Anti-Infectives:    None.  Subjective:   Gloria Wheeler says she feels much better.  No dizziness.  Says she is a bit tired.  No dyspnea or cough.  Appetite fair.  Objective:    Filed Vitals:   01/12/15 1635 01/12/15 1721 01/12/15 2210 01/13/15 0459  BP: 164/82 178/108 166/73 183/79  Pulse: 90 102 96 111  Temp: 98.4 F (36.9 C) 98.9 F (37.2 C) 99 F (37.2 C) 99.5 F (37.5 C)  TempSrc: Oral Oral Oral Oral  Resp: 18 16 16 16   Height:  5\' 4"  (1.626  m)    Weight:  68.04 kg (150 lb)    SpO2: 96% 100% 100% 100%    Intake/Output Summary (Last 24 hours) at 01/13/15 0834 Last data filed at 01/13/15 0630  Gross per 24 hour  Intake   1555 ml  Output    650 ml  Net    905 ml    Exam: Gen:  NAD Cardiovascular:  Tachy/reg, No M/R/G Respiratory:  Lungs CTAB Gastrointestinal:  Abdomen soft, NT/ND, + BS Extremities:  No C/E/C   Data Reviewed:    Labs: Basic Metabolic Panel:  Recent Labs Lab 01/12/15 1022  NA 139  K 3.8  CL 103  CO2 28  GLUCOSE 149*  BUN 13  CREATININE 0.69  CALCIUM 9.7   GFR Estimated Creatinine Clearance: 47.8 mL/min (by C-G formula based on Cr of 0.69).  CBC:  Recent Labs Lab 01/12/15 1022  WBC 6.7  NEUTROABS 4.8  HGB 14.0  HCT 41.2  MCV 87.8  PLT 181   CBG:  Recent Labs Lab 01/12/15 0901 01/12/15 1740 01/12/15 2208 01/13/15 0737  GLUCAP 142* 76 158* 141*   Microbiology No results found for this or any previous visit (from the past 240 hour(s)).   Medications:   . feeding supplement (GLUCERNA SHAKE)  237 mL Oral TID BM  . heparin  5,000 Units Subcutaneous 3 times per day  . insulin aspart  0-15 Units Subcutaneous TID WC  . insulin aspart  0-5 Units Subcutaneous QHS  . Linaclotide  145 mcg Oral q morning -  10a  . methimazole  5 mg Oral q morning - 10a  . pravastatin  20 mg Oral q1800  . vitamin C  500 mg Oral q morning - 10a   Continuous Infusions: . sodium chloride 100 mL/hr at 01/13/15 0204    Time spent: 25 minutes.     [facility] Pager [phone]. If unable to reach me by pager, please call my cell phone at [phone].  *Please refer to [email or web address], password [email or web address] to get updated schedule on who will round on this patient, as hospitalists switch teams weekly. If 7PM-7AM, please contact night-coverage at [email or web address], password [email or web address] for any overnight needs.  01/13/2015, 8:34 AM

## 2015-01-13 NOTE — Progress Notes (Signed)
Nutrition Brief Note  Patient identified on the Malnutrition Screening Tool (MST) Report  Wt Readings from Last 15 Encounters:  01/12/15 150 lb (68.04 kg)  11/09/13 145 lb 1 oz (65.8 kg)  01/18/13 151 lb 8 oz (68.72 kg)  12/31/12 152 lb (68.947 kg)    Body mass index is 25.73 kg/(m^2). Patient meets criteria for overweight based on current BMI.   Current diet order is carbohydrate modified, patient is consuming approximately 100% of meals at this time. Labs and medications reviewed.   No nutrition interventions warranted at this time. If nutrition issues arise, please consult RD.   Laurette Schimke Daisy, [place], [place]

## 2015-01-13 NOTE — Progress Notes (Signed)
UR completed 
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
## 2015-01-13 NOTE — Discharge Instructions (Signed)

## 2015-01-13 NOTE — Progress Notes (Signed)
RN reviewed discharge instructions with patient and daughter. All questions answered.   Paperwork given.   NT rolled patient down in wheelchair to family car.

## 2015-01-13 NOTE — Evaluation (Signed)
Physical Therapy Evaluation Patient Details Name: Gloria Wheeler MRN: [id number] DOB: May 13, 1926 Today's Date: 01/13/2015   History of Present Illness  Admitted with c/o dizziness/nausea and dx of orthostasis  Clinical Impression  Pt admitted with dx of orthostasis and, per pt, mobilizing at premorbid level.  Orthostatic BPs performed - provided to and recorded by RN.  Pt plans dc to home this date with support of family.    Follow Up Recommendations No PT follow up    Equipment Recommendations  None recommended by PT    Recommendations for Other Services       Precautions / Restrictions Restrictions Weight Bearing Restrictions: No      Mobility  Bed Mobility Overal bed mobility: Independent                Transfers Overall transfer level: Modified independent Equipment used: None;Rolling walker (2 wheeled)             General transfer comment: min cue for saftey with transition  Ambulation/Gait Ambulation/Gait assistance: Min guard;Supervision Ambulation Distance (Feet): 250 Feet Assistive device: Rolling walker (2 wheeled);None Gait Pattern/deviations: Step-through pattern;Decreased step length - right;Decreased step length - left;Shuffle Gait velocity: decr   General Gait Details: min instability, attempted RW use, pt states feels no steadier and feels like she walks at home but missing shoes  Stairs            Wheelchair Mobility    Modified Rankin (Stroke Patients Only)       Balance Overall balance assessment: Needs assistance Sitting-balance support: Feet supported;No upper extremity supported Sitting balance-Leahy Scale: Normal     Standing balance support: No upper extremity supported Standing balance-Leahy Scale: Good                               Pertinent Vitals/Pain Pain Assessment: No/denies pain    Home Living Family/patient expects to be discharged to:: Private residence Living Arrangements:  Children Available Help at Discharge: Family Type of Home: House Home Access: Level entry     Home Layout: One level Home Equipment: Environmental consultant - 2 wheels      Prior Function Level of Independence: Independent               Hand Dominance        Extremity/Trunk Assessment   Upper Extremity Assessment: Overall WFL for tasks assessed           Lower Extremity Assessment: Overall WFL for tasks assessed      Cervical / Trunk Assessment: Kyphotic  Communication   Communication: No difficulties  Cognition Arousal/Alertness: Awake/alert Behavior During Therapy: WFL for tasks assessed/performed Overall Cognitive Status: Within Functional Limits for tasks assessed                      General Comments      Exercises        Assessment/Plan    PT Assessment    PT Diagnosis Difficulty walking   PT Problem List    PT Treatment Interventions     PT Goals (Current goals can be found in the Care Plan section) Acute Rehab PT Goals Patient Stated Goal: Home  PT Goal Formulation: All assessment and education complete, DC therapy    Frequency Min 1X/week   Barriers to discharge        Co-evaluation               End  of Session Equipment Utilized During Treatment: Gait belt Activity Tolerance: Patient tolerated treatment well Patient left: in chair;with call bell/phone within reach Nurse Communication: Mobility status         Time: 1333-1405 PT Time Calculation (min) (ACUTE ONLY): 32 min   Charges:   PT Evaluation $Initial PT Evaluation Tier I: 1 Procedure PT Treatments $Gait Training: 8-22 mins   PT G Codes:        Gloria Wheeler 17-Jan-2015, 2:11 PM
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
## 2015-01-16 DIAGNOSIS — E780 Pure hypercholesterolemia: Secondary | ICD-10-CM | POA: Diagnosis not present

## 2015-01-16 DIAGNOSIS — E1122 Type 2 diabetes mellitus with diabetic chronic kidney disease: Secondary | ICD-10-CM | POA: Diagnosis not present

## 2015-01-16 DIAGNOSIS — I10 Essential (primary) hypertension: Secondary | ICD-10-CM | POA: Diagnosis not present

## 2015-01-16 NOTE — Progress Notes (Signed)
   01/13/15 1333  PT G-Codes **NOT FOR INPATIENT CLASS**  Functional Assessment Tool Used clinical judgement  Functional Limitation Mobility: Walking and moving around  Mobility: Walking and Moving Around Current Status (Z6109) CI  Mobility: Walking and Moving Around Goal Status (U0454) CI  Mobility: Walking and Moving Around Discharge Status [phone]) CI  G codes for evaluation performed by Debe Coder, PT.  Clide Dales, PT Pager: [phone] 01/16/2015

## 2015-01-17 DIAGNOSIS — R802 Orthostatic proteinuria, unspecified: Secondary | ICD-10-CM | POA: Diagnosis not present

## 2015-01-17 DIAGNOSIS — E785 Hyperlipidemia, unspecified: Secondary | ICD-10-CM | POA: Diagnosis not present

## 2015-01-17 DIAGNOSIS — E0590 Thyrotoxicosis, unspecified without thyrotoxic crisis or storm: Secondary | ICD-10-CM | POA: Diagnosis not present

## 2015-01-17 DIAGNOSIS — I10 Essential (primary) hypertension: Secondary | ICD-10-CM | POA: Diagnosis not present

## 2015-01-17 DIAGNOSIS — Z9012 Acquired absence of left breast and nipple: Secondary | ICD-10-CM | POA: Diagnosis not present

## 2015-01-17 DIAGNOSIS — E119 Type 2 diabetes mellitus without complications: Secondary | ICD-10-CM | POA: Diagnosis not present

## 2015-01-19 DIAGNOSIS — E0590 Thyrotoxicosis, unspecified without thyrotoxic crisis or storm: Secondary | ICD-10-CM | POA: Diagnosis not present

## 2015-01-19 DIAGNOSIS — E119 Type 2 diabetes mellitus without complications: Secondary | ICD-10-CM | POA: Diagnosis not present

## 2015-01-19 DIAGNOSIS — Z9012 Acquired absence of left breast and nipple: Secondary | ICD-10-CM | POA: Diagnosis not present

## 2015-01-19 DIAGNOSIS — R802 Orthostatic proteinuria, unspecified: Secondary | ICD-10-CM | POA: Diagnosis not present

## 2015-01-19 DIAGNOSIS — E785 Hyperlipidemia, unspecified: Secondary | ICD-10-CM | POA: Diagnosis not present

## 2015-01-19 DIAGNOSIS — I10 Essential (primary) hypertension: Secondary | ICD-10-CM | POA: Diagnosis not present

## 2015-01-24 DIAGNOSIS — E0590 Thyrotoxicosis, unspecified without thyrotoxic crisis or storm: Secondary | ICD-10-CM | POA: Diagnosis not present

## 2015-01-24 DIAGNOSIS — E785 Hyperlipidemia, unspecified: Secondary | ICD-10-CM | POA: Diagnosis not present

## 2015-01-24 DIAGNOSIS — Z9012 Acquired absence of left breast and nipple: Secondary | ICD-10-CM | POA: Diagnosis not present

## 2015-01-24 DIAGNOSIS — E119 Type 2 diabetes mellitus without complications: Secondary | ICD-10-CM | POA: Diagnosis not present

## 2015-01-24 DIAGNOSIS — R802 Orthostatic proteinuria, unspecified: Secondary | ICD-10-CM | POA: Diagnosis not present

## 2015-01-24 DIAGNOSIS — I10 Essential (primary) hypertension: Secondary | ICD-10-CM | POA: Diagnosis not present

## 2015-01-26 DIAGNOSIS — E0590 Thyrotoxicosis, unspecified without thyrotoxic crisis or storm: Secondary | ICD-10-CM | POA: Diagnosis not present

## 2015-01-26 DIAGNOSIS — E119 Type 2 diabetes mellitus without complications: Secondary | ICD-10-CM | POA: Diagnosis not present

## 2015-01-26 DIAGNOSIS — R802 Orthostatic proteinuria, unspecified: Secondary | ICD-10-CM | POA: Diagnosis not present

## 2015-01-26 DIAGNOSIS — Z9012 Acquired absence of left breast and nipple: Secondary | ICD-10-CM | POA: Diagnosis not present

## 2015-01-26 DIAGNOSIS — E785 Hyperlipidemia, unspecified: Secondary | ICD-10-CM | POA: Diagnosis not present

## 2015-01-26 DIAGNOSIS — I10 Essential (primary) hypertension: Secondary | ICD-10-CM | POA: Diagnosis not present

## 2015-02-02 DIAGNOSIS — E0590 Thyrotoxicosis, unspecified without thyrotoxic crisis or storm: Secondary | ICD-10-CM | POA: Diagnosis not present

## 2015-02-02 DIAGNOSIS — E785 Hyperlipidemia, unspecified: Secondary | ICD-10-CM | POA: Diagnosis not present

## 2015-02-02 DIAGNOSIS — Z9012 Acquired absence of left breast and nipple: Secondary | ICD-10-CM | POA: Diagnosis not present

## 2015-02-02 DIAGNOSIS — E119 Type 2 diabetes mellitus without complications: Secondary | ICD-10-CM | POA: Diagnosis not present

## 2015-02-02 DIAGNOSIS — R802 Orthostatic proteinuria, unspecified: Secondary | ICD-10-CM | POA: Diagnosis not present

## 2015-02-02 DIAGNOSIS — I10 Essential (primary) hypertension: Secondary | ICD-10-CM | POA: Diagnosis not present

## 2015-02-09 DIAGNOSIS — E0590 Thyrotoxicosis, unspecified without thyrotoxic crisis or storm: Secondary | ICD-10-CM | POA: Diagnosis not present

## 2015-02-09 DIAGNOSIS — E785 Hyperlipidemia, unspecified: Secondary | ICD-10-CM | POA: Diagnosis not present

## 2015-02-09 DIAGNOSIS — I10 Essential (primary) hypertension: Secondary | ICD-10-CM | POA: Diagnosis not present

## 2015-02-09 DIAGNOSIS — E119 Type 2 diabetes mellitus without complications: Secondary | ICD-10-CM | POA: Diagnosis not present

## 2015-02-09 DIAGNOSIS — Z9012 Acquired absence of left breast and nipple: Secondary | ICD-10-CM | POA: Diagnosis not present

## 2015-02-09 DIAGNOSIS — R802 Orthostatic proteinuria, unspecified: Secondary | ICD-10-CM | POA: Diagnosis not present

## 2015-02-14 DIAGNOSIS — E1165 Type 2 diabetes mellitus with hyperglycemia: Secondary | ICD-10-CM | POA: Diagnosis not present

## 2015-02-14 DIAGNOSIS — I10 Essential (primary) hypertension: Secondary | ICD-10-CM | POA: Diagnosis not present

## 2015-02-18 DIAGNOSIS — Z9012 Acquired absence of left breast and nipple: Secondary | ICD-10-CM | POA: Diagnosis not present

## 2015-02-18 DIAGNOSIS — I10 Essential (primary) hypertension: Secondary | ICD-10-CM | POA: Diagnosis not present

## 2015-02-18 DIAGNOSIS — E0590 Thyrotoxicosis, unspecified without thyrotoxic crisis or storm: Secondary | ICD-10-CM | POA: Diagnosis not present

## 2015-02-18 DIAGNOSIS — E119 Type 2 diabetes mellitus without complications: Secondary | ICD-10-CM | POA: Diagnosis not present

## 2015-02-18 DIAGNOSIS — E785 Hyperlipidemia, unspecified: Secondary | ICD-10-CM | POA: Diagnosis not present

## 2015-02-18 DIAGNOSIS — R802 Orthostatic proteinuria, unspecified: Secondary | ICD-10-CM | POA: Diagnosis not present

## 2015-03-02 DIAGNOSIS — E0590 Thyrotoxicosis, unspecified without thyrotoxic crisis or storm: Secondary | ICD-10-CM | POA: Diagnosis not present

## 2015-03-02 DIAGNOSIS — E785 Hyperlipidemia, unspecified: Secondary | ICD-10-CM | POA: Diagnosis not present

## 2015-03-02 DIAGNOSIS — Z9012 Acquired absence of left breast and nipple: Secondary | ICD-10-CM | POA: Diagnosis not present

## 2015-03-02 DIAGNOSIS — R802 Orthostatic proteinuria, unspecified: Secondary | ICD-10-CM | POA: Diagnosis not present

## 2015-03-02 DIAGNOSIS — E119 Type 2 diabetes mellitus without complications: Secondary | ICD-10-CM | POA: Diagnosis not present

## 2015-03-02 DIAGNOSIS — I10 Essential (primary) hypertension: Secondary | ICD-10-CM | POA: Diagnosis not present

## 2015-03-14 ENCOUNTER — Other Ambulatory Visit: Payer: Self-pay

## 2015-03-14 DIAGNOSIS — Z1231 Encounter for screening mammogram for malignant neoplasm of breast: Secondary | ICD-10-CM

## 2015-03-14 DIAGNOSIS — I10 Essential (primary) hypertension: Secondary | ICD-10-CM | POA: Diagnosis not present

## 2015-03-14 DIAGNOSIS — E1165 Type 2 diabetes mellitus with hyperglycemia: Secondary | ICD-10-CM | POA: Diagnosis not present

## 2015-03-14 DIAGNOSIS — Z9012 Acquired absence of left breast and nipple: Secondary | ICD-10-CM

## 2015-03-16 DIAGNOSIS — E0590 Thyrotoxicosis, unspecified without thyrotoxic crisis or storm: Secondary | ICD-10-CM | POA: Diagnosis not present

## 2015-03-16 DIAGNOSIS — E785 Hyperlipidemia, unspecified: Secondary | ICD-10-CM | POA: Diagnosis not present

## 2015-03-16 DIAGNOSIS — E119 Type 2 diabetes mellitus without complications: Secondary | ICD-10-CM | POA: Diagnosis not present

## 2015-03-16 DIAGNOSIS — Z9012 Acquired absence of left breast and nipple: Secondary | ICD-10-CM | POA: Diagnosis not present

## 2015-03-16 DIAGNOSIS — I10 Essential (primary) hypertension: Secondary | ICD-10-CM | POA: Diagnosis not present

## 2015-03-16 DIAGNOSIS — R802 Orthostatic proteinuria, unspecified: Secondary | ICD-10-CM | POA: Diagnosis not present

## 2015-04-11 DIAGNOSIS — E0590 Thyrotoxicosis, unspecified without thyrotoxic crisis or storm: Secondary | ICD-10-CM | POA: Diagnosis not present

## 2015-04-11 DIAGNOSIS — R802 Orthostatic proteinuria, unspecified: Secondary | ICD-10-CM | POA: Diagnosis not present

## 2015-04-11 DIAGNOSIS — Z9012 Acquired absence of left breast and nipple: Secondary | ICD-10-CM | POA: Diagnosis not present

## 2015-04-11 DIAGNOSIS — E119 Type 2 diabetes mellitus without complications: Secondary | ICD-10-CM | POA: Diagnosis not present

## 2015-04-11 DIAGNOSIS — E785 Hyperlipidemia, unspecified: Secondary | ICD-10-CM | POA: Diagnosis not present

## 2015-04-11 DIAGNOSIS — I10 Essential (primary) hypertension: Secondary | ICD-10-CM | POA: Diagnosis not present

## 2015-04-11 DIAGNOSIS — F69 Unspecified disorder of adult personality and behavior: Secondary | ICD-10-CM | POA: Diagnosis not present

## 2015-04-19 DIAGNOSIS — R802 Orthostatic proteinuria, unspecified: Secondary | ICD-10-CM | POA: Diagnosis not present

## 2015-04-19 DIAGNOSIS — Z9012 Acquired absence of left breast and nipple: Secondary | ICD-10-CM | POA: Diagnosis not present

## 2015-04-19 DIAGNOSIS — I10 Essential (primary) hypertension: Secondary | ICD-10-CM | POA: Diagnosis not present

## 2015-04-19 DIAGNOSIS — F69 Unspecified disorder of adult personality and behavior: Secondary | ICD-10-CM | POA: Diagnosis not present

## 2015-04-19 DIAGNOSIS — E0590 Thyrotoxicosis, unspecified without thyrotoxic crisis or storm: Secondary | ICD-10-CM | POA: Diagnosis not present

## 2015-04-19 DIAGNOSIS — E785 Hyperlipidemia, unspecified: Secondary | ICD-10-CM | POA: Diagnosis not present

## 2015-04-19 DIAGNOSIS — E119 Type 2 diabetes mellitus without complications: Secondary | ICD-10-CM | POA: Diagnosis not present

## 2015-04-20 DIAGNOSIS — Z9012 Acquired absence of left breast and nipple: Secondary | ICD-10-CM | POA: Diagnosis not present

## 2015-04-20 DIAGNOSIS — F69 Unspecified disorder of adult personality and behavior: Secondary | ICD-10-CM | POA: Diagnosis not present

## 2015-04-20 DIAGNOSIS — R802 Orthostatic proteinuria, unspecified: Secondary | ICD-10-CM | POA: Diagnosis not present

## 2015-04-20 DIAGNOSIS — E0590 Thyrotoxicosis, unspecified without thyrotoxic crisis or storm: Secondary | ICD-10-CM | POA: Diagnosis not present

## 2015-04-20 DIAGNOSIS — E119 Type 2 diabetes mellitus without complications: Secondary | ICD-10-CM | POA: Diagnosis not present

## 2015-04-20 DIAGNOSIS — E785 Hyperlipidemia, unspecified: Secondary | ICD-10-CM | POA: Diagnosis not present

## 2015-04-20 DIAGNOSIS — I10 Essential (primary) hypertension: Secondary | ICD-10-CM | POA: Diagnosis not present

## 2015-04-27 DIAGNOSIS — E785 Hyperlipidemia, unspecified: Secondary | ICD-10-CM | POA: Diagnosis not present

## 2015-04-27 DIAGNOSIS — E119 Type 2 diabetes mellitus without complications: Secondary | ICD-10-CM | POA: Diagnosis not present

## 2015-04-27 DIAGNOSIS — Z9012 Acquired absence of left breast and nipple: Secondary | ICD-10-CM | POA: Diagnosis not present

## 2015-04-27 DIAGNOSIS — E0590 Thyrotoxicosis, unspecified without thyrotoxic crisis or storm: Secondary | ICD-10-CM | POA: Diagnosis not present

## 2015-04-27 DIAGNOSIS — F69 Unspecified disorder of adult personality and behavior: Secondary | ICD-10-CM | POA: Diagnosis not present

## 2015-04-27 DIAGNOSIS — R802 Orthostatic proteinuria, unspecified: Secondary | ICD-10-CM | POA: Diagnosis not present

## 2015-04-27 DIAGNOSIS — I10 Essential (primary) hypertension: Secondary | ICD-10-CM | POA: Diagnosis not present

## 2015-05-04 DIAGNOSIS — E785 Hyperlipidemia, unspecified: Secondary | ICD-10-CM | POA: Diagnosis not present

## 2015-05-04 DIAGNOSIS — R802 Orthostatic proteinuria, unspecified: Secondary | ICD-10-CM | POA: Diagnosis not present

## 2015-05-04 DIAGNOSIS — F69 Unspecified disorder of adult personality and behavior: Secondary | ICD-10-CM | POA: Diagnosis not present

## 2015-05-04 DIAGNOSIS — I10 Essential (primary) hypertension: Secondary | ICD-10-CM | POA: Diagnosis not present

## 2015-05-04 DIAGNOSIS — E119 Type 2 diabetes mellitus without complications: Secondary | ICD-10-CM | POA: Diagnosis not present

## 2015-05-04 DIAGNOSIS — Z9012 Acquired absence of left breast and nipple: Secondary | ICD-10-CM | POA: Diagnosis not present

## 2015-05-04 DIAGNOSIS — E0590 Thyrotoxicosis, unspecified without thyrotoxic crisis or storm: Secondary | ICD-10-CM | POA: Diagnosis not present

## 2015-05-25 DIAGNOSIS — I10 Essential (primary) hypertension: Secondary | ICD-10-CM | POA: Diagnosis not present

## 2015-05-25 DIAGNOSIS — E118 Type 2 diabetes mellitus with unspecified complications: Secondary | ICD-10-CM | POA: Diagnosis not present

## 2015-06-06 DIAGNOSIS — N390 Urinary tract infection, site not specified: Secondary | ICD-10-CM | POA: Diagnosis not present

## 2015-07-18 DIAGNOSIS — E1122 Type 2 diabetes mellitus with diabetic chronic kidney disease: Secondary | ICD-10-CM | POA: Diagnosis not present

## 2015-07-18 DIAGNOSIS — F0391 Unspecified dementia with behavioral disturbance: Secondary | ICD-10-CM | POA: Diagnosis not present

## 2015-07-18 DIAGNOSIS — I10 Essential (primary) hypertension: Secondary | ICD-10-CM | POA: Diagnosis not present

## 2015-08-01 ENCOUNTER — Ambulatory Visit: Payer: Medicare Other

## 2015-08-10 DIAGNOSIS — I10 Essential (primary) hypertension: Secondary | ICD-10-CM | POA: Diagnosis not present

## 2015-08-10 DIAGNOSIS — F0391 Unspecified dementia with behavioral disturbance: Secondary | ICD-10-CM | POA: Diagnosis not present

## 2015-08-10 DIAGNOSIS — F22 Delusional disorders: Secondary | ICD-10-CM | POA: Diagnosis not present

## 2015-08-10 DIAGNOSIS — F29 Unspecified psychosis not due to a substance or known physiological condition: Secondary | ICD-10-CM | POA: Diagnosis not present

## 2015-08-10 DIAGNOSIS — E119 Type 2 diabetes mellitus without complications: Secondary | ICD-10-CM | POA: Diagnosis not present

## 2015-09-14 DIAGNOSIS — I10 Essential (primary) hypertension: Secondary | ICD-10-CM | POA: Diagnosis not present

## 2015-09-14 DIAGNOSIS — F29 Unspecified psychosis not due to a substance or known physiological condition: Secondary | ICD-10-CM | POA: Diagnosis not present

## 2015-09-14 DIAGNOSIS — F0391 Unspecified dementia with behavioral disturbance: Secondary | ICD-10-CM | POA: Diagnosis not present

## 2015-09-14 DIAGNOSIS — E119 Type 2 diabetes mellitus without complications: Secondary | ICD-10-CM | POA: Diagnosis not present

## 2015-09-14 DIAGNOSIS — F22 Delusional disorders: Secondary | ICD-10-CM | POA: Diagnosis not present

## 2015-09-19 DIAGNOSIS — I10 Essential (primary) hypertension: Secondary | ICD-10-CM | POA: Diagnosis not present

## 2015-09-19 DIAGNOSIS — E118 Type 2 diabetes mellitus with unspecified complications: Secondary | ICD-10-CM | POA: Diagnosis not present

## 2015-09-19 DIAGNOSIS — I499 Cardiac arrhythmia, unspecified: Secondary | ICD-10-CM | POA: Diagnosis not present

## 2015-10-05 DIAGNOSIS — E7879 Other disorders of bile acid and cholesterol metabolism: Secondary | ICD-10-CM | POA: Diagnosis not present

## 2015-10-05 DIAGNOSIS — E08311 Diabetes mellitus due to underlying condition with unspecified diabetic retinopathy with macular edema: Secondary | ICD-10-CM | POA: Diagnosis not present

## 2015-10-13 DIAGNOSIS — E7879 Other disorders of bile acid and cholesterol metabolism: Secondary | ICD-10-CM | POA: Diagnosis not present

## 2015-10-13 DIAGNOSIS — E0865 Diabetes mellitus due to underlying condition with hyperglycemia: Secondary | ICD-10-CM | POA: Diagnosis not present

## 2015-10-18 DIAGNOSIS — E7879 Other disorders of bile acid and cholesterol metabolism: Secondary | ICD-10-CM | POA: Diagnosis not present

## 2015-10-18 DIAGNOSIS — E08311 Diabetes mellitus due to underlying condition with unspecified diabetic retinopathy with macular edema: Secondary | ICD-10-CM | POA: Diagnosis not present

## 2015-10-31 DIAGNOSIS — I10 Essential (primary) hypertension: Secondary | ICD-10-CM | POA: Diagnosis not present

## 2015-10-31 DIAGNOSIS — F0391 Unspecified dementia with behavioral disturbance: Secondary | ICD-10-CM | POA: Diagnosis not present

## 2015-11-02 DIAGNOSIS — F0391 Unspecified dementia with behavioral disturbance: Secondary | ICD-10-CM | POA: Diagnosis not present

## 2015-11-02 DIAGNOSIS — E039 Hypothyroidism, unspecified: Secondary | ICD-10-CM | POA: Diagnosis not present

## 2015-11-02 DIAGNOSIS — E119 Type 2 diabetes mellitus without complications: Secondary | ICD-10-CM | POA: Diagnosis not present

## 2015-11-02 DIAGNOSIS — F29 Unspecified psychosis not due to a substance or known physiological condition: Secondary | ICD-10-CM | POA: Diagnosis not present

## 2015-11-02 DIAGNOSIS — K5900 Constipation, unspecified: Secondary | ICD-10-CM | POA: Diagnosis not present

## 2015-11-20 DIAGNOSIS — E039 Hypothyroidism, unspecified: Secondary | ICD-10-CM | POA: Diagnosis not present

## 2015-11-20 DIAGNOSIS — E118 Type 2 diabetes mellitus with unspecified complications: Secondary | ICD-10-CM | POA: Diagnosis not present

## 2015-11-20 DIAGNOSIS — L84 Corns and callosities: Secondary | ICD-10-CM | POA: Diagnosis not present

## 2015-11-20 DIAGNOSIS — I10 Essential (primary) hypertension: Secondary | ICD-10-CM | POA: Diagnosis not present

## 2015-11-22 DIAGNOSIS — L84 Corns and callosities: Secondary | ICD-10-CM | POA: Diagnosis not present

## 2015-11-22 DIAGNOSIS — E1151 Type 2 diabetes mellitus with diabetic peripheral angiopathy without gangrene: Secondary | ICD-10-CM | POA: Diagnosis not present

## 2015-11-30 DIAGNOSIS — E119 Type 2 diabetes mellitus without complications: Secondary | ICD-10-CM | POA: Diagnosis not present

## 2015-11-30 DIAGNOSIS — I10 Essential (primary) hypertension: Secondary | ICD-10-CM | POA: Diagnosis not present

## 2015-11-30 DIAGNOSIS — F29 Unspecified psychosis not due to a substance or known physiological condition: Secondary | ICD-10-CM | POA: Diagnosis not present

## 2015-11-30 DIAGNOSIS — C50919 Malignant neoplasm of unspecified site of unspecified female breast: Secondary | ICD-10-CM | POA: Diagnosis not present

## 2015-11-30 DIAGNOSIS — F0391 Unspecified dementia with behavioral disturbance: Secondary | ICD-10-CM | POA: Diagnosis not present

## 2016-01-04 DIAGNOSIS — E0590 Thyrotoxicosis, unspecified without thyrotoxic crisis or storm: Secondary | ICD-10-CM | POA: Diagnosis not present

## 2016-01-04 DIAGNOSIS — F0391 Unspecified dementia with behavioral disturbance: Secondary | ICD-10-CM | POA: Diagnosis not present

## 2016-01-04 DIAGNOSIS — C50919 Malignant neoplasm of unspecified site of unspecified female breast: Secondary | ICD-10-CM | POA: Diagnosis not present

## 2016-01-04 DIAGNOSIS — F29 Unspecified psychosis not due to a substance or known physiological condition: Secondary | ICD-10-CM | POA: Diagnosis not present

## 2016-01-04 DIAGNOSIS — I10 Essential (primary) hypertension: Secondary | ICD-10-CM | POA: Diagnosis not present

## 2016-01-24 DIAGNOSIS — I10 Essential (primary) hypertension: Secondary | ICD-10-CM | POA: Diagnosis not present

## 2016-01-24 DIAGNOSIS — F062 Psychotic disorder with delusions due to known physiological condition: Secondary | ICD-10-CM | POA: Diagnosis not present

## 2016-01-25 DIAGNOSIS — F0391 Unspecified dementia with behavioral disturbance: Secondary | ICD-10-CM | POA: Diagnosis not present

## 2016-01-25 DIAGNOSIS — F062 Psychotic disorder with delusions due to known physiological condition: Secondary | ICD-10-CM | POA: Diagnosis not present

## 2016-01-25 DIAGNOSIS — I10 Essential (primary) hypertension: Secondary | ICD-10-CM | POA: Diagnosis not present

## 2016-01-25 DIAGNOSIS — E118 Type 2 diabetes mellitus with unspecified complications: Secondary | ICD-10-CM | POA: Diagnosis not present

## 2016-01-25 DIAGNOSIS — R41841 Cognitive communication deficit: Secondary | ICD-10-CM | POA: Diagnosis not present

## 2016-01-29 DIAGNOSIS — R41841 Cognitive communication deficit: Secondary | ICD-10-CM | POA: Diagnosis not present

## 2016-01-29 DIAGNOSIS — F062 Psychotic disorder with delusions due to known physiological condition: Secondary | ICD-10-CM | POA: Diagnosis not present

## 2016-01-29 DIAGNOSIS — E118 Type 2 diabetes mellitus with unspecified complications: Secondary | ICD-10-CM | POA: Diagnosis not present

## 2016-01-29 DIAGNOSIS — I10 Essential (primary) hypertension: Secondary | ICD-10-CM | POA: Diagnosis not present

## 2016-01-29 DIAGNOSIS — F0391 Unspecified dementia with behavioral disturbance: Secondary | ICD-10-CM | POA: Diagnosis not present

## 2016-01-30 DIAGNOSIS — F062 Psychotic disorder with delusions due to known physiological condition: Secondary | ICD-10-CM | POA: Diagnosis not present

## 2016-01-30 DIAGNOSIS — R41841 Cognitive communication deficit: Secondary | ICD-10-CM | POA: Diagnosis not present

## 2016-01-30 DIAGNOSIS — E118 Type 2 diabetes mellitus with unspecified complications: Secondary | ICD-10-CM | POA: Diagnosis not present

## 2016-01-30 DIAGNOSIS — F0391 Unspecified dementia with behavioral disturbance: Secondary | ICD-10-CM | POA: Diagnosis not present

## 2016-01-30 DIAGNOSIS — I10 Essential (primary) hypertension: Secondary | ICD-10-CM | POA: Diagnosis not present

## 2016-01-31 DIAGNOSIS — F0391 Unspecified dementia with behavioral disturbance: Secondary | ICD-10-CM | POA: Diagnosis not present

## 2016-01-31 DIAGNOSIS — R41841 Cognitive communication deficit: Secondary | ICD-10-CM | POA: Diagnosis not present

## 2016-01-31 DIAGNOSIS — I10 Essential (primary) hypertension: Secondary | ICD-10-CM | POA: Diagnosis not present

## 2016-01-31 DIAGNOSIS — E118 Type 2 diabetes mellitus with unspecified complications: Secondary | ICD-10-CM | POA: Diagnosis not present

## 2016-01-31 DIAGNOSIS — F062 Psychotic disorder with delusions due to known physiological condition: Secondary | ICD-10-CM | POA: Diagnosis not present

## 2016-02-01 DIAGNOSIS — R41841 Cognitive communication deficit: Secondary | ICD-10-CM | POA: Diagnosis not present

## 2016-02-01 DIAGNOSIS — F062 Psychotic disorder with delusions due to known physiological condition: Secondary | ICD-10-CM | POA: Diagnosis not present

## 2016-02-01 DIAGNOSIS — F0391 Unspecified dementia with behavioral disturbance: Secondary | ICD-10-CM | POA: Diagnosis not present

## 2016-02-01 DIAGNOSIS — F29 Unspecified psychosis not due to a substance or known physiological condition: Secondary | ICD-10-CM | POA: Diagnosis not present

## 2016-02-01 DIAGNOSIS — C50919 Malignant neoplasm of unspecified site of unspecified female breast: Secondary | ICD-10-CM | POA: Diagnosis not present

## 2016-02-01 DIAGNOSIS — E119 Type 2 diabetes mellitus without complications: Secondary | ICD-10-CM | POA: Diagnosis not present

## 2016-02-01 DIAGNOSIS — E118 Type 2 diabetes mellitus with unspecified complications: Secondary | ICD-10-CM | POA: Diagnosis not present

## 2016-02-01 DIAGNOSIS — I10 Essential (primary) hypertension: Secondary | ICD-10-CM | POA: Diagnosis not present

## 2016-02-02 DIAGNOSIS — I10 Essential (primary) hypertension: Secondary | ICD-10-CM | POA: Diagnosis not present

## 2016-02-02 DIAGNOSIS — R41841 Cognitive communication deficit: Secondary | ICD-10-CM | POA: Diagnosis not present

## 2016-02-02 DIAGNOSIS — F0391 Unspecified dementia with behavioral disturbance: Secondary | ICD-10-CM | POA: Diagnosis not present

## 2016-02-02 DIAGNOSIS — F062 Psychotic disorder with delusions due to known physiological condition: Secondary | ICD-10-CM | POA: Diagnosis not present

## 2016-02-02 DIAGNOSIS — E118 Type 2 diabetes mellitus with unspecified complications: Secondary | ICD-10-CM | POA: Diagnosis not present

## 2016-02-05 DIAGNOSIS — R41841 Cognitive communication deficit: Secondary | ICD-10-CM | POA: Diagnosis not present

## 2016-02-05 DIAGNOSIS — E118 Type 2 diabetes mellitus with unspecified complications: Secondary | ICD-10-CM | POA: Diagnosis not present

## 2016-02-05 DIAGNOSIS — F062 Psychotic disorder with delusions due to known physiological condition: Secondary | ICD-10-CM | POA: Diagnosis not present

## 2016-02-05 DIAGNOSIS — I10 Essential (primary) hypertension: Secondary | ICD-10-CM | POA: Diagnosis not present

## 2016-02-06 DIAGNOSIS — R41841 Cognitive communication deficit: Secondary | ICD-10-CM | POA: Diagnosis not present

## 2016-02-06 DIAGNOSIS — I10 Essential (primary) hypertension: Secondary | ICD-10-CM | POA: Diagnosis not present

## 2016-02-06 DIAGNOSIS — F062 Psychotic disorder with delusions due to known physiological condition: Secondary | ICD-10-CM | POA: Diagnosis not present

## 2016-02-06 DIAGNOSIS — E118 Type 2 diabetes mellitus with unspecified complications: Secondary | ICD-10-CM | POA: Diagnosis not present

## 2016-02-07 DIAGNOSIS — I10 Essential (primary) hypertension: Secondary | ICD-10-CM | POA: Diagnosis not present

## 2016-02-07 DIAGNOSIS — R41841 Cognitive communication deficit: Secondary | ICD-10-CM | POA: Diagnosis not present

## 2016-02-07 DIAGNOSIS — F062 Psychotic disorder with delusions due to known physiological condition: Secondary | ICD-10-CM | POA: Diagnosis not present

## 2016-02-07 DIAGNOSIS — E118 Type 2 diabetes mellitus with unspecified complications: Secondary | ICD-10-CM | POA: Diagnosis not present

## 2016-02-08 DIAGNOSIS — R41841 Cognitive communication deficit: Secondary | ICD-10-CM | POA: Diagnosis not present

## 2016-02-08 DIAGNOSIS — E118 Type 2 diabetes mellitus with unspecified complications: Secondary | ICD-10-CM | POA: Diagnosis not present

## 2016-02-08 DIAGNOSIS — F062 Psychotic disorder with delusions due to known physiological condition: Secondary | ICD-10-CM | POA: Diagnosis not present

## 2016-02-08 DIAGNOSIS — I10 Essential (primary) hypertension: Secondary | ICD-10-CM | POA: Diagnosis not present

## 2016-02-09 DIAGNOSIS — F062 Psychotic disorder with delusions due to known physiological condition: Secondary | ICD-10-CM | POA: Diagnosis not present

## 2016-02-09 DIAGNOSIS — I10 Essential (primary) hypertension: Secondary | ICD-10-CM | POA: Diagnosis not present

## 2016-02-09 DIAGNOSIS — E118 Type 2 diabetes mellitus with unspecified complications: Secondary | ICD-10-CM | POA: Diagnosis not present

## 2016-02-09 DIAGNOSIS — R41841 Cognitive communication deficit: Secondary | ICD-10-CM | POA: Diagnosis not present

## 2016-02-12 DIAGNOSIS — F062 Psychotic disorder with delusions due to known physiological condition: Secondary | ICD-10-CM | POA: Diagnosis not present

## 2016-02-12 DIAGNOSIS — R41841 Cognitive communication deficit: Secondary | ICD-10-CM | POA: Diagnosis not present

## 2016-02-12 DIAGNOSIS — I10 Essential (primary) hypertension: Secondary | ICD-10-CM | POA: Diagnosis not present

## 2016-02-12 DIAGNOSIS — E118 Type 2 diabetes mellitus with unspecified complications: Secondary | ICD-10-CM | POA: Diagnosis not present

## 2016-02-13 DIAGNOSIS — F062 Psychotic disorder with delusions due to known physiological condition: Secondary | ICD-10-CM | POA: Diagnosis not present

## 2016-02-13 DIAGNOSIS — R41841 Cognitive communication deficit: Secondary | ICD-10-CM | POA: Diagnosis not present

## 2016-02-13 DIAGNOSIS — E118 Type 2 diabetes mellitus with unspecified complications: Secondary | ICD-10-CM | POA: Diagnosis not present

## 2016-02-13 DIAGNOSIS — I10 Essential (primary) hypertension: Secondary | ICD-10-CM | POA: Diagnosis not present

## 2016-02-14 DIAGNOSIS — F062 Psychotic disorder with delusions due to known physiological condition: Secondary | ICD-10-CM | POA: Diagnosis not present

## 2016-02-14 DIAGNOSIS — R41841 Cognitive communication deficit: Secondary | ICD-10-CM | POA: Diagnosis not present

## 2016-02-14 DIAGNOSIS — E118 Type 2 diabetes mellitus with unspecified complications: Secondary | ICD-10-CM | POA: Diagnosis not present

## 2016-02-14 DIAGNOSIS — I10 Essential (primary) hypertension: Secondary | ICD-10-CM | POA: Diagnosis not present

## 2016-02-15 DIAGNOSIS — R41841 Cognitive communication deficit: Secondary | ICD-10-CM | POA: Diagnosis not present

## 2016-02-15 DIAGNOSIS — F062 Psychotic disorder with delusions due to known physiological condition: Secondary | ICD-10-CM | POA: Diagnosis not present

## 2016-02-15 DIAGNOSIS — E118 Type 2 diabetes mellitus with unspecified complications: Secondary | ICD-10-CM | POA: Diagnosis not present

## 2016-02-15 DIAGNOSIS — I10 Essential (primary) hypertension: Secondary | ICD-10-CM | POA: Diagnosis not present

## 2016-02-16 DIAGNOSIS — R41841 Cognitive communication deficit: Secondary | ICD-10-CM | POA: Diagnosis not present

## 2016-02-16 DIAGNOSIS — F062 Psychotic disorder with delusions due to known physiological condition: Secondary | ICD-10-CM | POA: Diagnosis not present

## 2016-02-16 DIAGNOSIS — E118 Type 2 diabetes mellitus with unspecified complications: Secondary | ICD-10-CM | POA: Diagnosis not present

## 2016-02-16 DIAGNOSIS — I10 Essential (primary) hypertension: Secondary | ICD-10-CM | POA: Diagnosis not present

## 2016-02-19 DIAGNOSIS — E039 Hypothyroidism, unspecified: Secondary | ICD-10-CM | POA: Diagnosis not present

## 2016-02-19 DIAGNOSIS — I10 Essential (primary) hypertension: Secondary | ICD-10-CM | POA: Diagnosis not present

## 2016-02-19 DIAGNOSIS — E118 Type 2 diabetes mellitus with unspecified complications: Secondary | ICD-10-CM | POA: Diagnosis not present

## 2016-02-19 DIAGNOSIS — M1990 Unspecified osteoarthritis, unspecified site: Secondary | ICD-10-CM | POA: Diagnosis not present

## 2016-02-20 DIAGNOSIS — E118 Type 2 diabetes mellitus with unspecified complications: Secondary | ICD-10-CM | POA: Diagnosis not present

## 2016-02-20 DIAGNOSIS — F062 Psychotic disorder with delusions due to known physiological condition: Secondary | ICD-10-CM | POA: Diagnosis not present

## 2016-02-20 DIAGNOSIS — R41841 Cognitive communication deficit: Secondary | ICD-10-CM | POA: Diagnosis not present

## 2016-02-20 DIAGNOSIS — I10 Essential (primary) hypertension: Secondary | ICD-10-CM | POA: Diagnosis not present

## 2016-02-21 DIAGNOSIS — H524 Presbyopia: Secondary | ICD-10-CM | POA: Diagnosis not present

## 2016-02-21 DIAGNOSIS — Z961 Presence of intraocular lens: Secondary | ICD-10-CM | POA: Diagnosis not present

## 2016-02-21 DIAGNOSIS — E113412 Type 2 diabetes mellitus with severe nonproliferative diabetic retinopathy with macular edema, left eye: Secondary | ICD-10-CM | POA: Diagnosis not present

## 2016-02-22 DIAGNOSIS — R41841 Cognitive communication deficit: Secondary | ICD-10-CM | POA: Diagnosis not present

## 2016-02-22 DIAGNOSIS — E118 Type 2 diabetes mellitus with unspecified complications: Secondary | ICD-10-CM | POA: Diagnosis not present

## 2016-02-22 DIAGNOSIS — I10 Essential (primary) hypertension: Secondary | ICD-10-CM | POA: Diagnosis not present

## 2016-02-22 DIAGNOSIS — F062 Psychotic disorder with delusions due to known physiological condition: Secondary | ICD-10-CM | POA: Diagnosis not present

## 2016-02-23 DIAGNOSIS — I10 Essential (primary) hypertension: Secondary | ICD-10-CM | POA: Diagnosis not present

## 2016-02-23 DIAGNOSIS — F062 Psychotic disorder with delusions due to known physiological condition: Secondary | ICD-10-CM | POA: Diagnosis not present

## 2016-02-23 DIAGNOSIS — E118 Type 2 diabetes mellitus with unspecified complications: Secondary | ICD-10-CM | POA: Diagnosis not present

## 2016-02-23 DIAGNOSIS — R41841 Cognitive communication deficit: Secondary | ICD-10-CM | POA: Diagnosis not present

## 2016-03-14 DIAGNOSIS — I10 Essential (primary) hypertension: Secondary | ICD-10-CM | POA: Diagnosis not present

## 2016-03-14 DIAGNOSIS — F322 Major depressive disorder, single episode, severe without psychotic features: Secondary | ICD-10-CM | POA: Diagnosis not present

## 2016-03-14 DIAGNOSIS — E119 Type 2 diabetes mellitus without complications: Secondary | ICD-10-CM | POA: Diagnosis not present

## 2016-03-14 DIAGNOSIS — E0590 Thyrotoxicosis, unspecified without thyrotoxic crisis or storm: Secondary | ICD-10-CM | POA: Diagnosis not present

## 2016-03-14 DIAGNOSIS — F0391 Unspecified dementia with behavioral disturbance: Secondary | ICD-10-CM | POA: Diagnosis not present

## 2016-03-18 DIAGNOSIS — M79672 Pain in left foot: Secondary | ICD-10-CM | POA: Diagnosis not present

## 2016-03-18 DIAGNOSIS — E119 Type 2 diabetes mellitus without complications: Secondary | ICD-10-CM | POA: Diagnosis not present

## 2016-03-18 DIAGNOSIS — M79671 Pain in right foot: Secondary | ICD-10-CM | POA: Diagnosis not present

## 2016-03-18 DIAGNOSIS — B351 Tinea unguium: Secondary | ICD-10-CM | POA: Diagnosis not present

## 2016-04-15 DIAGNOSIS — E113551 Type 2 diabetes mellitus with stable proliferative diabetic retinopathy, right eye: Secondary | ICD-10-CM | POA: Diagnosis not present

## 2016-04-15 DIAGNOSIS — E113512 Type 2 diabetes mellitus with proliferative diabetic retinopathy with macular edema, left eye: Secondary | ICD-10-CM | POA: Diagnosis not present

## 2016-04-15 DIAGNOSIS — Z794 Long term (current) use of insulin: Secondary | ICD-10-CM | POA: Diagnosis not present

## 2016-04-15 DIAGNOSIS — Z961 Presence of intraocular lens: Secondary | ICD-10-CM | POA: Diagnosis not present

## 2016-04-18 DIAGNOSIS — E0590 Thyrotoxicosis, unspecified without thyrotoxic crisis or storm: Secondary | ICD-10-CM | POA: Diagnosis not present

## 2016-04-18 DIAGNOSIS — M7989 Other specified soft tissue disorders: Secondary | ICD-10-CM | POA: Diagnosis not present

## 2016-04-18 DIAGNOSIS — I872 Venous insufficiency (chronic) (peripheral): Secondary | ICD-10-CM | POA: Diagnosis not present

## 2016-04-19 DIAGNOSIS — E039 Hypothyroidism, unspecified: Secondary | ICD-10-CM | POA: Diagnosis not present

## 2016-04-19 DIAGNOSIS — R600 Localized edema: Secondary | ICD-10-CM | POA: Diagnosis not present

## 2016-04-30 DIAGNOSIS — R269 Unspecified abnormalities of gait and mobility: Secondary | ICD-10-CM | POA: Diagnosis not present

## 2016-05-01 DIAGNOSIS — R269 Unspecified abnormalities of gait and mobility: Secondary | ICD-10-CM | POA: Diagnosis not present

## 2016-05-02 DIAGNOSIS — R269 Unspecified abnormalities of gait and mobility: Secondary | ICD-10-CM | POA: Diagnosis not present

## 2016-05-03 DIAGNOSIS — R269 Unspecified abnormalities of gait and mobility: Secondary | ICD-10-CM | POA: Diagnosis not present

## 2017-01-02 IMAGING — CT CT HEAD W/O CM
2 series · 16 of 30 positions shown, 20 images · non-contrast
Comparison: None.

CLINICAL DATA: Headache, dizziness, bilateral lower extremity
weakness

EXAM:
CT HEAD WITHOUT CONTRAST
TECHNIQUE: Contiguous axial images were obtained from the base of the skull
through the vertex without intravenous contrast.

[Series 2: head w/o · axial · non-contrast · 0.45mm/px · z∈[-157,-37]mm · 13 of 28 slices shown, 17 images]
[im 2/28  brain]
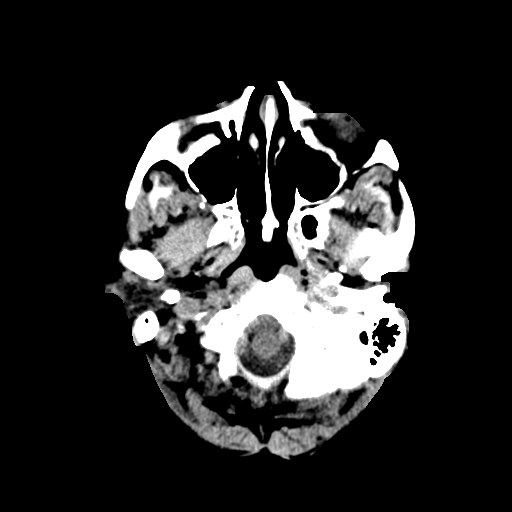
[im 2/28  bone]
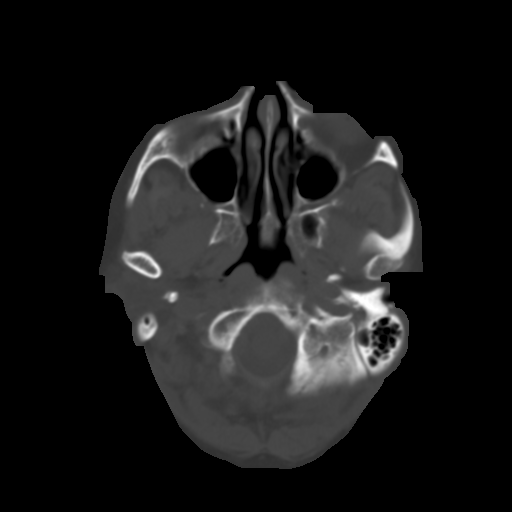
[im 4/28  brain]
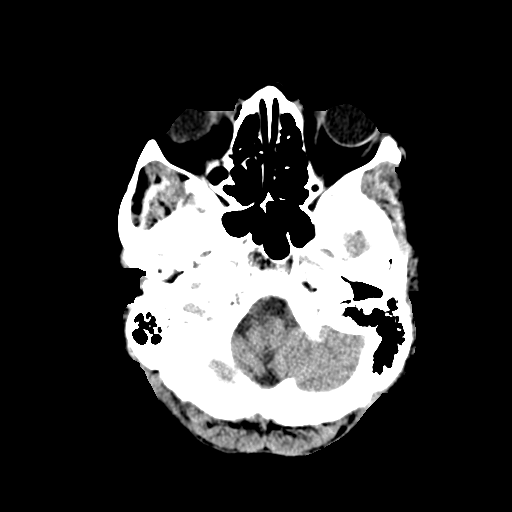
[im 6/28  brain]
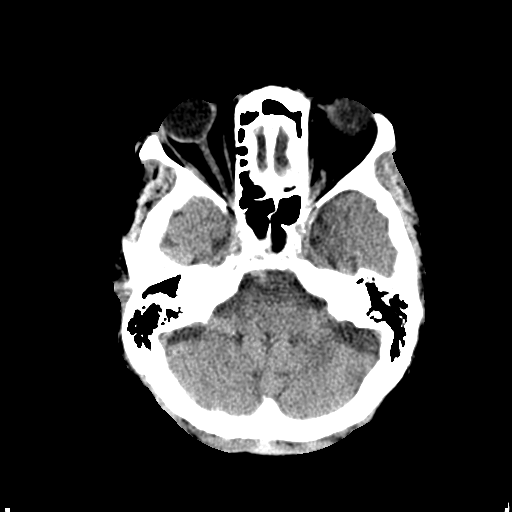
[im 8/28  brain]
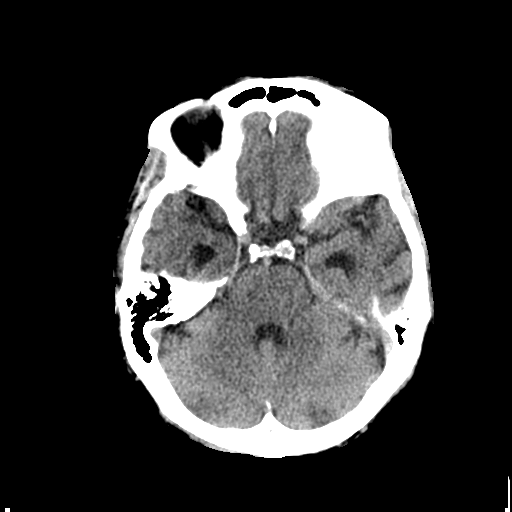
[im 10/28  brain]
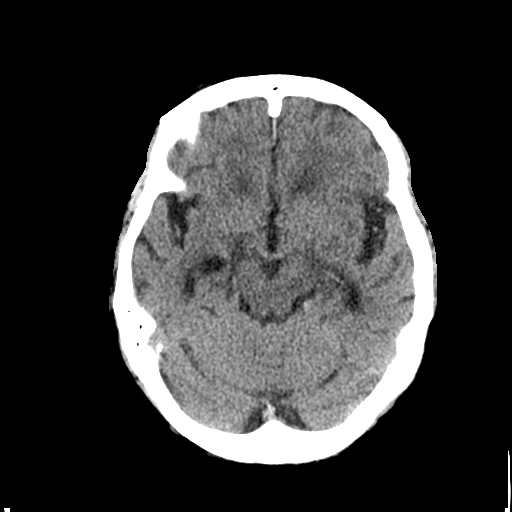
[im 10/28  bone]
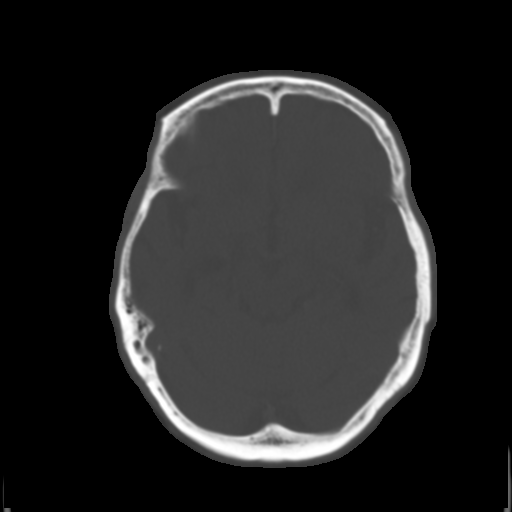
[im 12/28  brain]
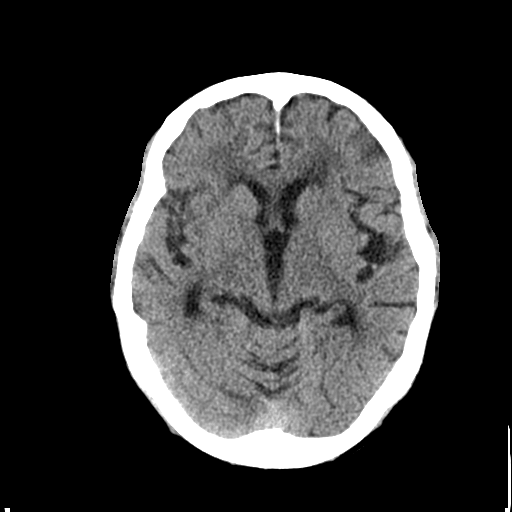
[im 14/28  brain]
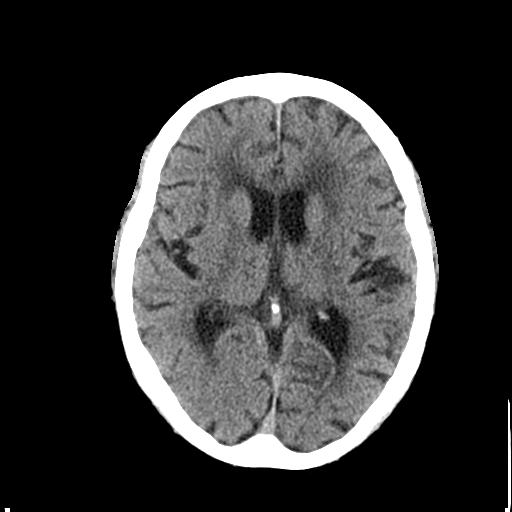
[im 16/28  brain]
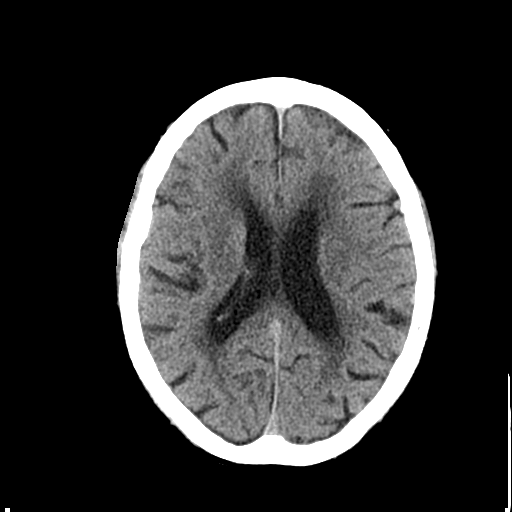
[im 18/28  brain]
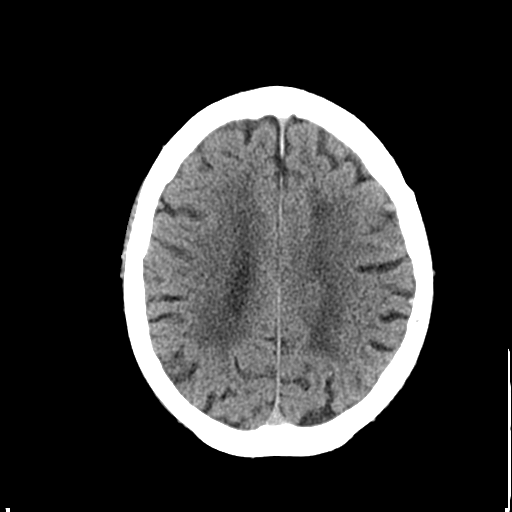
[im 18/28  bone]
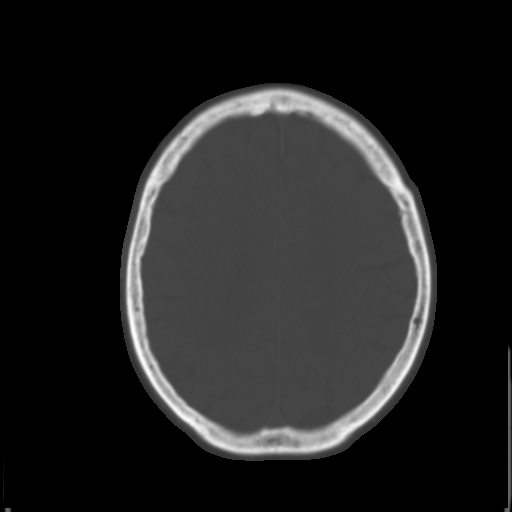
[im 20/28  brain]
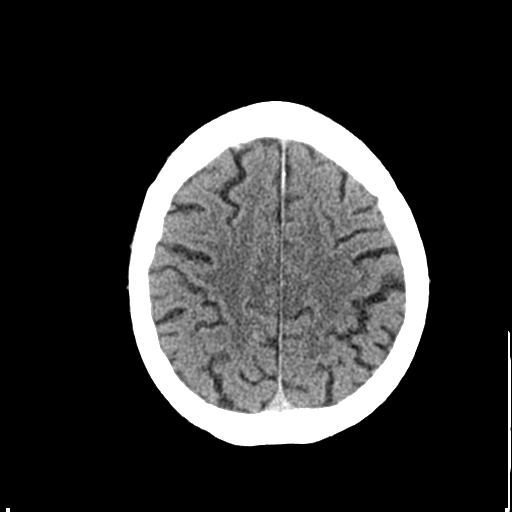
[im 22/28  brain]
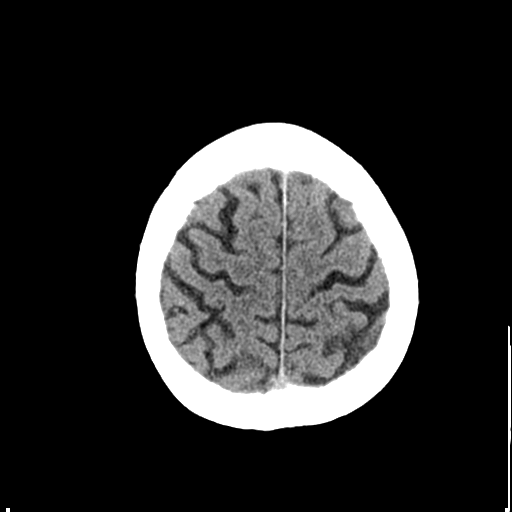
[im 24/28  brain]
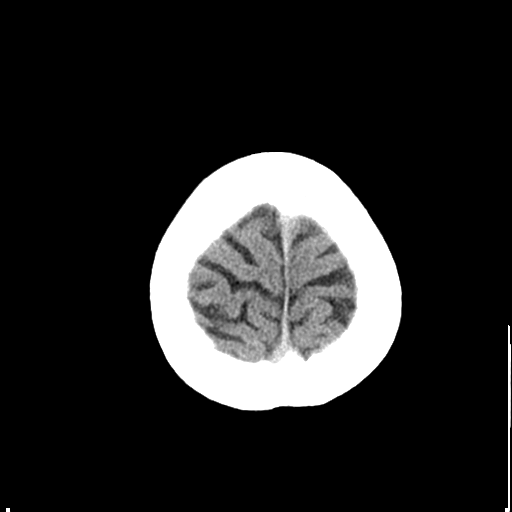
[im 26/28  brain]
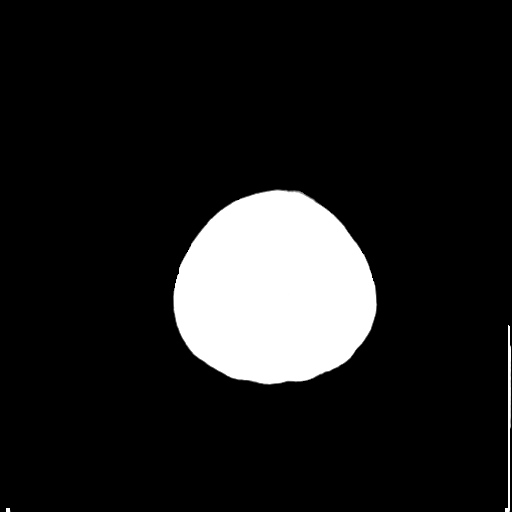
[im 26/28  bone]
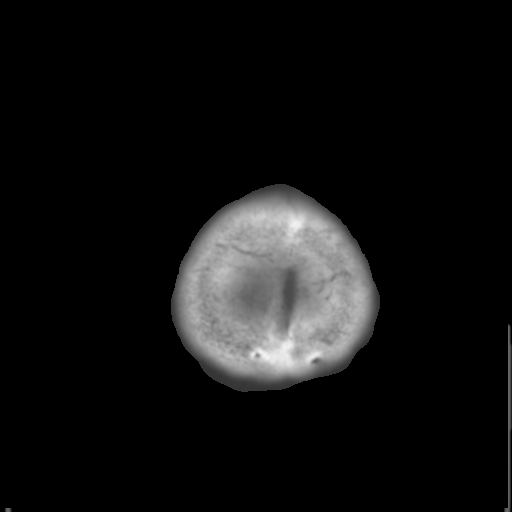

[Series 3: bone windows · axial · 0.45mm/px · z∈[-157,-117]mm · 3 of 28 slices shown]
[im 2/28  bone]
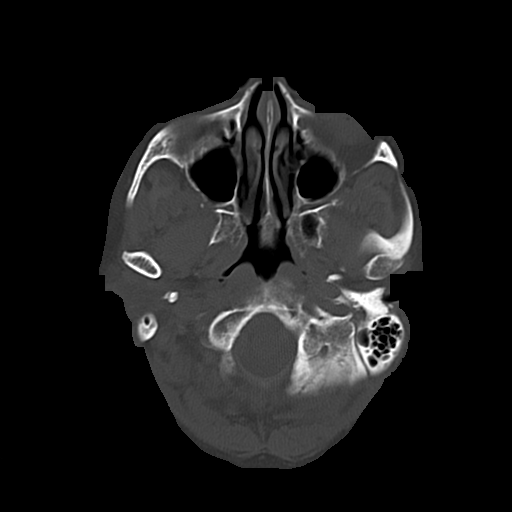
[im 6/28  bone]
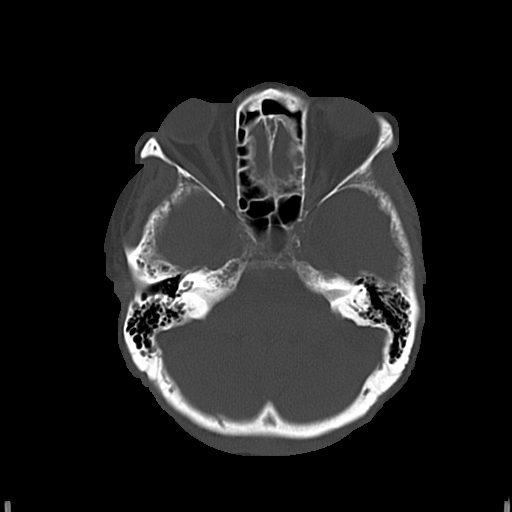
[im 10/28  bone]
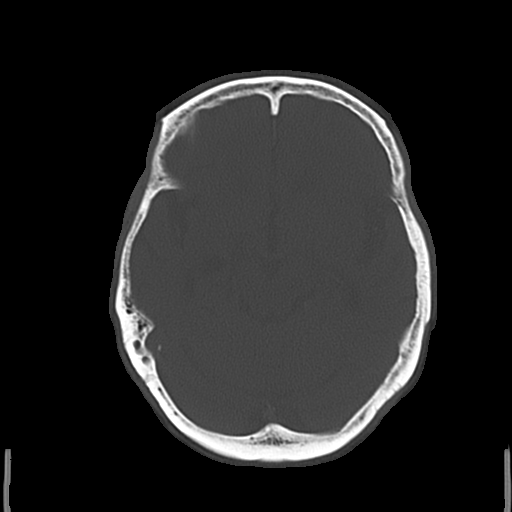

[16 of 30 positions shown; findings below may reference images not displayed]

FINDINGS: No skull fracture is noted. Paranasal sinuses and mastoid air cells
are unremarkable. No intracranial hemorrhage, mass effect or midline
shift. There is mild cerebral atrophy. Periventricular and patchy
subcortical white matter decreased attenuation probable due to
chronic small vessel ischemic changes. Atherosclerotic
calcifications of carotid siphon.

No acute cortical infarction. No mass lesion is noted on this
unenhanced scan.
IMPRESSION: No acute intracranial abnormality. Mild cerebral atrophy.
Periventricular and patchy subcortical white matter decreased
attenuation probable due to chronic small vessel ischemic changes.

## 2018-05-27 ENCOUNTER — Emergency Department (HOSPITAL_COMMUNITY)
Admission: EM | Admit: 2018-05-27 | Discharge: 2018-05-27 | Disposition: A | Discharge status: Skilled Nursing Facility | Attending: Emergency Medicine | Admitting: Emergency Medicine

## 2018-05-27 ENCOUNTER — Encounter (HOSPITAL_COMMUNITY): Payer: Self-pay

## 2018-05-27 ENCOUNTER — Emergency Department (HOSPITAL_COMMUNITY)

## 2018-05-27 ENCOUNTER — Other Ambulatory Visit: Payer: Self-pay

## 2018-05-27 DIAGNOSIS — Z853 Personal history of malignant neoplasm of breast: Secondary | ICD-10-CM | POA: Diagnosis not present

## 2018-05-27 DIAGNOSIS — E162 Hypoglycemia, unspecified: Secondary | ICD-10-CM | POA: Insufficient documentation

## 2018-05-27 DIAGNOSIS — E119 Type 2 diabetes mellitus without complications: Secondary | ICD-10-CM | POA: Diagnosis not present

## 2018-05-27 DIAGNOSIS — Y92129 Unspecified place in nursing home as the place of occurrence of the external cause: Secondary | ICD-10-CM | POA: Diagnosis not present

## 2018-05-27 DIAGNOSIS — Y999 Unspecified external cause status: Secondary | ICD-10-CM | POA: Diagnosis not present

## 2018-05-27 DIAGNOSIS — W19XXXA Unspecified fall, initial encounter: Secondary | ICD-10-CM | POA: Diagnosis not present

## 2018-05-27 DIAGNOSIS — S72011A Unspecified intracapsular fracture of right femur, initial encounter for closed fracture: Secondary | ICD-10-CM | POA: Diagnosis not present

## 2018-05-27 DIAGNOSIS — Y939 Activity, unspecified: Secondary | ICD-10-CM | POA: Diagnosis not present

## 2018-05-27 DIAGNOSIS — Z794 Long term (current) use of insulin: Secondary | ICD-10-CM | POA: Diagnosis not present

## 2018-05-27 DIAGNOSIS — Z79899 Other long term (current) drug therapy: Secondary | ICD-10-CM | POA: Insufficient documentation

## 2018-05-27 DIAGNOSIS — I10 Essential (primary) hypertension: Secondary | ICD-10-CM | POA: Diagnosis not present

## 2018-05-27 DIAGNOSIS — S79921A Unspecified injury of right thigh, initial encounter: Secondary | ICD-10-CM | POA: Diagnosis present

## 2018-05-27 LAB — CBC WITH DIFFERENTIAL/PLATELET
BASOS ABS: 0 10*3/uL (ref 0.0–0.1)
Basophils Relative: 0 %
EOS PCT: 0 %
Eosinophils Absolute: 0 10*3/uL (ref 0.0–0.7)
HCT: 45.9 % (ref 36.0–46.0)
Hemoglobin: 15.5 g/dL — ABNORMAL HIGH (ref 12.0–15.0)
LYMPHS ABS: 0.9 10*3/uL (ref 0.7–4.0)
LYMPHS PCT: 5 %
MCH: 28.5 pg (ref 26.0–34.0)
MCHC: 33.8 g/dL (ref 30.0–36.0)
MCV: 84.4 fL (ref 78.0–100.0)
Monocytes Absolute: 0.7 10*3/uL (ref 0.1–1.0)
Monocytes Relative: 4 %
NEUTROS PCT: 91 %
Neutro Abs: 17.3 10*3/uL — ABNORMAL HIGH (ref 1.7–7.7)
PLATELETS: 209 10*3/uL (ref 150–400)
RBC: 5.44 MIL/uL — AB (ref 3.87–5.11)
RDW: 17.8 % — ABNORMAL HIGH (ref 11.5–15.5)
WBC: 19 10*3/uL — AB (ref 4.0–10.5)

## 2018-05-27 LAB — BASIC METABOLIC PANEL
ANION GAP: 19 — AB (ref 5–15)
BUN: 34 mg/dL — AB (ref 8–23)
CHLORIDE: 98 mmol/L (ref 98–111)
CO2: 23 mmol/L (ref 22–32)
Calcium: 9.8 mg/dL (ref 8.9–10.3)
Creatinine, Ser: 1.63 mg/dL — ABNORMAL HIGH (ref 0.44–1.00)
GFR calc non Af Amer: 27 mL/min — ABNORMAL LOW (ref >60)
GFR, EST AFRICAN AMERICAN: 31 mL/min — AB (ref >60)
Glucose, Bld: 45 mg/dL — ABNORMAL LOW (ref 70–99)
POTASSIUM: 4.9 mmol/L (ref 3.5–5.1)
SODIUM: 140 mmol/L (ref 135–145)

## 2018-05-27 LAB — CBG MONITORING, ED
Glucose-Capillary: 92 mg/dL (ref 70–99)
Glucose-Capillary: 132 mg/dL — ABNORMAL HIGH (ref 70–99)

## 2018-05-27 LAB — PROTIME-INR
INR: 1.82
PROTHROMBIN TIME: 20.9 s — AB (ref 11.4–15.2)

## 2018-05-27 MED ORDER — DEXTROSE 50 % IV SOLN
0.5000 | Freq: Once | INTRAVENOUS | Status: AC
  Administered 2018-05-27: 25 mL via INTRAVENOUS
  Filled 2018-05-27: qty 50

## 2018-05-27 MED ORDER — SODIUM CHLORIDE 0.9 % IV BOLUS
500.0000 mL | Freq: Once | INTRAVENOUS | Status: AC
  Administered 2018-05-27: 500 mL via INTRAVENOUS

## 2018-05-27 MED ORDER — FENTANYL CITRATE (PF) 100 MCG/2ML IJ SOLN: 50.0000 ug | INTRAMUSCULAR | Status: DC | PRN
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
## 2018-05-27 NOTE — Discharge Instructions (Signed)
X-rays performed here confirms right subcapital hip fracture.  Results were discussed with on-call orthopedist, Dr. Griffin Basil, who confirms there are no other therapeutic measures or mobilizations other than bedrest and pain control.  Ms. Gloria Wheeler was found to be hypoglycemic and received IV dextrose and a fluid bolus.

## 2018-05-27 NOTE — ED Notes (Signed)
Bed: VU34 Expected date:  Expected time:  Means of arrival:  Comments: EMS/chronic pain

## 2018-05-27 NOTE — ED Provider Notes (Signed)
[facility] DEPT Provider Note   CSN: [id number] Arrival date & time: 05/27/18  1238     History   Chief Complaint Chief Complaint  Patient presents with  . Fall    HPI Gloria Wheeler is a 84 y.o. female.  Patient with history of diabetes, breast cancer --presents from nursing facility after a fall.  Per EMS report, patient "fell last night".  Patient history obtained from patient's daughter at bedside.  Patient had a fall 3 days ago.  She had x-rays dated 05/24/2018 which shows a right hip fracture.  Patient has reportedly been receiving pain control at the facility using morphine.  Facility sent the patient to the emergency department today for orthopedic recommendations.  Per daughter, it is their wishes for patient to not have surgery on her hip fracture.  They would like for the patient to be made as comfortable as possible.  Hospice nurse at bedside who corroborates his history and gives additional details.  Level 5 caveat due to somnolence, nonverbal.  She has DNR present.     Past Medical History:  Diagnosis Date  . Chronic constipation   . Diabetes mellitus without complication ([place])   . Goiter 11/09/2013  . Hyperlipidemia   . Hypertension   . Hyperthyroidism   . lt breast ca w/ recurrence dx'd 1993 and 2006   lumpectomy and then masectomy    Patient Active Problem List   Diagnosis Date Noted  . Orthostasis 01/12/2015  . Stress at home 01/12/2015  . Goiter 11/09/2013  . Hyperthyroidism 11/09/2013  . Diabetes mellitus without complication ([place])   . Hypertension   . Hyperlipidemia   . HX: breast cancer 12/31/2012    Past Surgical History:  Procedure Laterality Date  . ABDOMINAL HYSTERECTOMY  2007  . BREAST LUMPECTOMY  1996   left  . MASTECTOMY Left      OB History   None      Home Medications    Prior to Admission medications   Medication Sig Start Date End Date Taking? Authorizing Provider  COD LIVER OIL PO Take 1  tablet by mouth daily.    [provider]  feeding supplement, GLUCERNA SHAKE, (GLUCERNA SHAKE) LIQD Take 237 mLs by mouth 3 (three) times daily between meals. 01/13/15   Rama, Venetia Maxon, MD  HUMULIN N 100 UNIT/ML injection Inject 10 Units into the skin every evening.  12/13/12   [provider]  Linaclotide (LINZESS) 145 MCG CAPS capsule Take 145 mcg by mouth every morning.    [provider]  lovastatin (MEVACOR) 20 MG tablet Take 20 mg by mouth at bedtime.    [provider]  methimazole (TAPAZOLE) 5 MG tablet Take 5 mg by mouth every morning.  11/11/12   [provider]  metoprolol succinate (TOPROL-XL) 100 MG 24 hr tablet Take 100 mg by mouth every morning. Take with or immediately following a meal.    [provider]  vitamin C (ASCORBIC ACID) 500 MG tablet Take 500 mg by mouth every morning.     [provider]    Family History Family History  Problem Relation Age of Onset  . Diabetes Mother   . Diabetes Daughter   . Multiple sclerosis Son     Social History Social History   Tobacco Use  . Smoking status: Never Smoker  . Smokeless tobacco: Never Used  Substance Use Topics  . Alcohol use: Yes    Alcohol/week: 0.6 oz    Types:  1 Cans of beer per week  . Drug use: No     Allergies   Patient has no known allergies.   Review of Systems Review of Systems  Unable to perform ROS: Mental status change  All other systems reviewed and are negative.    Physical Exam Updated Vital Signs BP (!) 116/101   Pulse 83   Resp 16   SpO2 95%   Physical Exam  Constitutional: She appears well-developed and well-nourished.  HENT:  Head: Normocephalic and atraumatic.  Eyes: Conjunctivae are normal. Right eye exhibits no discharge. Left eye exhibits no discharge.  Neck: Normal range of motion. Neck supple.  Cardiovascular: Normal rate, regular rhythm and normal heart sounds.  Pulses:      Dorsalis pedis pulses are 2+  on the right side, and 2+ on the left side.  Pulmonary/Chest: Effort normal and breath sounds normal. She has no wheezes.  Abdominal: Soft. There is no tenderness.  Musculoskeletal: She exhibits tenderness.  Patient with foreshortening and external rotation of the right lower extremity.  Neurological:  Patient somnolent, not responsive to questioning however when moving patient from the EMS stretcher she verbalizes "my right hip, my right hip"  Skin: Skin is warm and dry.  Psychiatric: She has a normal mood and affect.  Nursing note and vitals reviewed.    ED Treatments / Results  Labs (all labs ordered are listed, but only abnormal results are displayed) Labs Reviewed  BASIC METABOLIC PANEL - Abnormal; Notable for the following components:      Result Value   Glucose, Bld 45 (*)    BUN 34 (*)    Creatinine, Ser 1.63 (*)    GFR calc non Af Amer 27 (*)    GFR calc Af Amer 31 (*)    Anion gap 19 (*)    All other components within normal limits  CBC WITH DIFFERENTIAL/PLATELET - Abnormal; Notable for the following components:   WBC 19.0 (*)    RBC 5.44 (*)    Hemoglobin 15.5 (*)    RDW 17.8 (*)    Neutro Abs 17.3 (*)    All other components within normal limits  PROTIME-INR - Abnormal; Notable for the following components:   Prothrombin Time 20.9 (*)    All other components within normal limits  URINE CULTURE  URINALYSIS, ROUTINE W REFLEX MICROSCOPIC  CBG MONITORING, ED  TYPE AND SCREEN  ABO/RH    EKG EKG Interpretation  Date/Time:  Wednesday May 27 2018 13:01:06 EDT Ventricular Rate:  86 PR Interval:    QRS Duration: 96 QT Interval:  393 QTC Calculation: 471 R Axis:   -23 Text Interpretation:  Sinus rhythm LVH with secondary repolarization abnormality no acute st/s Confirmed by Aletta Edouard [phone]) on 05/27/2018 1:20:54 PM   Radiology Dg Chest 1 View  Result Date: 05/27/2018 CLINICAL DATA:  Hip fracture, diabetes, vomiting. EXAM: CHEST  1 VIEW COMPARISON:   CT scan of the chest of November 09, 2013 and chest x-ray of the same day FINDINGS: The lungs are borderline hypoinflated. There is no focal infiltrate. The cardiac silhouette is enlarged but stable. The central pulmonary vascularity is prominent. There is multilevel degenerative disc disease of the thoracic spine with curvature of the lower thoracic and lumbar spine convex toward the right. There degenerative changes of the right shoulder. IMPRESSION: Cardiomegaly with mild central pulmonary vascular prominence which appears stable. No definite pulmonary edema. No acute pneumonia. Electronically Signed   By: David  Martinique M.D.  On: 05/27/2018 13:32   Dg Hip Unilat W Or Wo Pelvis 2-3 Views Right  Result Date: 05/27/2018 CLINICAL DATA:  Fall yesterday with known history of right hip fracture EXAM: DG HIP (WITH OR WITHOUT PELVIS) 2-3V RIGHT COMPARISON:  None. FINDINGS: Pelvic ring is intact. There is evidence of subcapital right femoral neck fracture with impaction and angulation at the fracture site. Degenerative changes of the lumbar spine are noted. IMPRESSION: Subcapital right femoral neck fracture. Electronically Signed   By: Inez Catalina M.D.   On: 05/27/2018 14:30    Procedures Procedures (including critical care time)  Medications Ordered in ED Medications  fentaNYL (SUBLIMAZE) injection 50 mcg (has no administration in time range)     Initial Impression / Assessment and Plan / ED Course  I have reviewed the triage vital signs and the nursing notes.  Pertinent labs & imaging results that were available during my care of the patient were reviewed by me and considered in my medical decision making (see chart for details).  Clinical Course as of May 27 1502  Wed May 28, 4743  4512 84 year old female whose  under hospice care here after a fall both on Saturday and a fall yesterday.  She was identified as having a hip fracture on Sunday.  She is brought in today for evaluation for possible  brace as the family does not want to pursue any surgical options.  The patient herself cannot give any opinion on this.  She is got some bruising on her face but is otherwise in no distress.  We have repeated some x-rays and blood work and will review with orthopedist on call.  Family expectation is that she be returned to the facility.   [MB]    Clinical Course User Index [MB] Hayden Rasmussen, MD    Patient seen and examined. Work-up initiated.  Discussed patient with Dr. Melina Copa who will see.  Patient reportedly hypotensive for EMS, initial blood pressure 116/101 here.  Vital signs reviewed and are as follows: BP (!) 116/101   Pulse 83   Resp 16   SpO2 95%    I had a enlightening discussion with the patient's daughter and hospice nurse at bedside.  We will proceed with imaging of the hip here and discuss with orthopedic to determine if they have any further recommendations.  Discussed case with Dr. Griffin Basil of orthopedic surgery.  Unfortunately, other than bedrest and pain control, there are no other additive therapeutic measures for the patient.  This conversation was relayed to the patient's daughter at bedside who understands.  Plan is for recheck of blood sugar which was found to be low earlier, and return to the facility where she will continue comfort measures.   Final Clinical Impressions(s) / ED Diagnoses   Final diagnoses:  Subcapital fracture of hip, right, closed, initial encounter [facility])  Hypoglycemia   Patient with the above injuries on hospice care.  Sent for orthopedic recommendations only today.  Family does not desire for her to have surgery.  She has had a significant decline prior to this over the past year.  She will be transported back to her facility to continue comfort measures.    ED Discharge Orders    None       Carlisle Cater, Hershal Coria 05/27/18 1514    Hayden Rasmussen, MD 05/28/18 [phone]

## 2018-05-27 NOTE — ED Triage Notes (Signed)
EMS reports called out to [facility] staff reports unwitnessed fall last night. Radiology report dated 7/21 Dx with right femur fx. Pt DNR. Non communicative at present but displays pain response on movement.  BP 101/73 HR 80 Resp 18 Sp02 98 RA

## 2018-05-28 LAB — TYPE AND SCREEN
ABO/RH(D): O POS
ANTIBODY SCREEN: NEGATIVE

## 2018-05-28 LAB — ABO/RH: ABO/RH(D): O POS

## 2018-07-05 DEATH — deceased
